# Patient Record
Sex: Female | Born: 1970
Health system: Southern US, Community
[De-identification: ages and names within clinical notes are randomized; demographics above are authoritative.]

## PROBLEM LIST (undated history)

## (undated) DIAGNOSIS — I10 Essential (primary) hypertension: Secondary | ICD-10-CM

## (undated) DIAGNOSIS — T7840XA Allergy, unspecified, initial encounter: Secondary | ICD-10-CM

## (undated) DIAGNOSIS — E049 Nontoxic goiter, unspecified: Secondary | ICD-10-CM

## (undated) DIAGNOSIS — K219 Gastro-esophageal reflux disease without esophagitis: Secondary | ICD-10-CM

## (undated) DIAGNOSIS — E669 Obesity, unspecified: Secondary | ICD-10-CM

## (undated) DIAGNOSIS — F32A Depression, unspecified: Secondary | ICD-10-CM

## (undated) DIAGNOSIS — F419 Anxiety disorder, unspecified: Secondary | ICD-10-CM

## (undated) DIAGNOSIS — M1990 Unspecified osteoarthritis, unspecified site: Secondary | ICD-10-CM

## (undated) DIAGNOSIS — F329 Major depressive disorder, single episode, unspecified: Secondary | ICD-10-CM

## (undated) HISTORY — PX: EYE SURGERY: SHX253

## (undated) HISTORY — PX: CHOLECYSTECTOMY: SHX55

## (undated) HISTORY — DX: Anxiety disorder, unspecified: F41.9

## (undated) HISTORY — DX: Gastro-esophageal reflux disease without esophagitis: K21.9

## (undated) HISTORY — DX: Depression, unspecified: F32.A

## (undated) HISTORY — PX: TUBAL LIGATION: SHX77

## (undated) HISTORY — DX: Allergy, unspecified, initial encounter: T78.40XA

## (undated) HISTORY — DX: Nontoxic goiter, unspecified: E04.9

## (undated) HISTORY — DX: Major depressive disorder, single episode, unspecified: F32.9

## (undated) HISTORY — DX: Obesity, unspecified: E66.9

## (undated) HISTORY — DX: Unspecified osteoarthritis, unspecified site: M19.90

---

## 1991-12-29 HISTORY — PX: BREAST CYST EXCISION: SHX579

## 2013-10-24 ENCOUNTER — Ambulatory Visit: Payer: Self-pay | Admitting: Family Medicine

## 2013-10-26 DIAGNOSIS — E041 Nontoxic single thyroid nodule: Secondary | ICD-10-CM | POA: Insufficient documentation

## 2014-01-02 ENCOUNTER — Ambulatory Visit: Payer: Self-pay | Admitting: Family Medicine

## 2014-01-27 ENCOUNTER — Ambulatory Visit: Payer: Self-pay | Admitting: Ophthalmology

## 2014-02-13 ENCOUNTER — Ambulatory Visit: Payer: Self-pay

## 2014-05-08 DIAGNOSIS — D4989 Neoplasm of unspecified behavior of other specified sites: Secondary | ICD-10-CM | POA: Insufficient documentation

## 2014-07-04 DIAGNOSIS — H04009 Unspecified dacryoadenitis, unspecified lacrimal gland: Secondary | ICD-10-CM | POA: Insufficient documentation

## 2014-07-25 ENCOUNTER — Emergency Department: Payer: Self-pay | Admitting: Emergency Medicine

## 2014-07-25 LAB — CBC
HCT: 41 % (ref 35.0–47.0)
HGB: 13.5 g/dL (ref 12.0–16.0)
MCH: 29.2 pg (ref 26.0–34.0)
MCHC: 33 g/dL (ref 32.0–36.0)
MCV: 88 fL (ref 80–100)
Platelet: 207 x10 3/mm 3 (ref 150–440)
RBC: 4.64 X10 6/mm 3 (ref 3.80–5.20)
RDW: 13.5 % (ref 11.5–14.5)
WBC: 8.1 x10 3/mm 3 (ref 3.6–11.0)

## 2014-07-25 LAB — TROPONIN I: Troponin-I: <0.02 ng/mL

## 2014-07-25 LAB — BASIC METABOLIC PANEL WITH GFR
Anion Gap: 9 (ref 7–16)
BUN: 12 mg/dL (ref 7–18)
Calcium, Total: 8.4 mg/dL — ABNORMAL LOW (ref 8.5–10.1)
Chloride: 106 mmol/L (ref 98–107)
Co2: 25 mmol/L (ref 21–32)
Creatinine: 0.93 mg/dL (ref 0.60–1.30)
EGFR (African American): >60
EGFR (Non-African Amer.): >60
Glucose: 145 mg/dL — ABNORMAL HIGH (ref 65–99)
Osmolality: 282 (ref 275–301)
Potassium: 3.5 mmol/L (ref 3.5–5.1)
Sodium: 140 mmol/L (ref 136–145)

## 2014-08-10 DIAGNOSIS — H05119 Granuloma of unspecified orbit: Secondary | ICD-10-CM | POA: Insufficient documentation

## 2014-11-01 DIAGNOSIS — E559 Vitamin D deficiency, unspecified: Secondary | ICD-10-CM | POA: Insufficient documentation

## 2014-11-01 DIAGNOSIS — E669 Obesity, unspecified: Secondary | ICD-10-CM | POA: Insufficient documentation

## 2014-11-01 DIAGNOSIS — L409 Psoriasis, unspecified: Secondary | ICD-10-CM | POA: Insufficient documentation

## 2014-11-01 DIAGNOSIS — I10 Essential (primary) hypertension: Secondary | ICD-10-CM | POA: Insufficient documentation

## 2014-11-05 DIAGNOSIS — E781 Pure hyperglyceridemia: Secondary | ICD-10-CM | POA: Insufficient documentation

## 2014-11-05 DIAGNOSIS — R7303 Prediabetes: Secondary | ICD-10-CM | POA: Insufficient documentation

## 2015-08-15 ENCOUNTER — Emergency Department
Admission: EM | Admit: 2015-08-15 | Discharge: 2015-08-15 | Disposition: A | Discharge status: Home / Self Care | Payer: 59 | Attending: Student | Admitting: Student

## 2015-08-15 ENCOUNTER — Emergency Department: Payer: 59

## 2015-08-15 DIAGNOSIS — Z87891 Personal history of nicotine dependence: Secondary | ICD-10-CM | POA: Diagnosis not present

## 2015-08-15 DIAGNOSIS — Y998 Other external cause status: Secondary | ICD-10-CM | POA: Insufficient documentation

## 2015-08-15 DIAGNOSIS — Y9389 Activity, other specified: Secondary | ICD-10-CM | POA: Diagnosis not present

## 2015-08-15 DIAGNOSIS — X58XXXA Exposure to other specified factors, initial encounter: Secondary | ICD-10-CM | POA: Diagnosis not present

## 2015-08-15 DIAGNOSIS — I10 Essential (primary) hypertension: Secondary | ICD-10-CM | POA: Insufficient documentation

## 2015-08-15 DIAGNOSIS — S93402A Sprain of unspecified ligament of left ankle, initial encounter: Secondary | ICD-10-CM | POA: Diagnosis not present

## 2015-08-15 DIAGNOSIS — Y9289 Other specified places as the place of occurrence of the external cause: Secondary | ICD-10-CM | POA: Diagnosis not present

## 2015-08-15 DIAGNOSIS — S99912A Unspecified injury of left ankle, initial encounter: Secondary | ICD-10-CM | POA: Diagnosis present

## 2015-08-15 HISTORY — DX: Essential (primary) hypertension: I10

## 2015-08-15 MED ORDER — IBUPROFEN 800 MG PO TABS: 800.0000 mg | ORAL_TABLET | Freq: Three times a day (TID) | ORAL | Status: DC

## 2015-08-15 NOTE — ED Notes (Signed)
Patient to ED with left ankle pain, reports twisting earlier.

## 2015-08-15 NOTE — Discharge Instructions (Signed)
Ankle Sprain An ankle sprain is an injury to the strong, fibrous tissues (ligaments) that hold your ankle bones together.  HOME CARE   Put ice on your ankle for 1-2 days or as told by your doctor.  Put ice in a plastic bag.  Place a towel between your skin and the bag.  Leave the ice on for 15-20 minutes at a time, every 2 hours while you are awake.  Only take medicine as told by your doctor.  Raise (elevate) your injured ankle above the level of your heart as much as possible for 2-3 days.  Use crutches if your doctor tells you to. Slowly put your own weight on the affected ankle. Use the crutches until you can walk without pain.  If you have a plaster splint:  Do not rest it on anything harder than a pillow for 24 hours.  Do not put weight on it.  Do not get it wet.  Take it off to shower or bathe.  If given, use an elastic wrap or support stocking for support. Take the wrap off if your toes lose feeling (numb), tingle, or turn cold or blue.  If you have an air splint:  Add or let out air to make it comfortable.  Take it off at night and to shower and bathe.  Wiggle your toes and move your ankle up and down often while you are wearing it. GET HELP IF:  You have rapidly increasing bruising or puffiness (swelling).  Your toes feel very cold.  You lose feeling in your foot.  Your medicine does not help your pain. GET HELP RIGHT AWAY IF:   Your toes lose feeling (numb) or turn blue.  You have severe pain that is increasing. MAKE SURE YOU:   Understand these instructions.  Will watch your condition.  Will get help right away if you are not doing well or get worse. Document Released: 06/01/2008 Document Revised: 04/30/2014 Document Reviewed: 06/27/2012 [facility] Patient Information 2015 [place], [place]. This information is not intended to replace advice given to you by your health care provider. Make sure you discuss any questions you have with your health care  provider.    ICE AND ELEVATE USE CRUTCHES UNTIL ABLE TO WALK AND BEAR WEIGHT  WITHOUT PAIN IBUPROFEN FOR PAIN AND INFLAMMATION

## 2015-08-15 NOTE — ED Provider Notes (Signed)
[facility] Emergency Department Provider Note  ____________________________________________  Time seen: Approximately 7:54 AM  I have reviewed the triage vital signs and the nursing notes.   HISTORY  Chief Complaint Ankle Pain   HPI Ashley Paul is a 44 y.o. female is here with complaint of left ankle pain. She states she twisted it earlier and now is very painful. She denies any previous problems with her ankle.She states it is very difficult for her to bear weight at this time. Currently she is rates her pain a 10 out of 10.   Past Medical History  Diagnosis Date  . Hypertension     There are no active problems to display for this patient.   Past Surgical History  Procedure Laterality Date  . Cholecystectomy    . Cesarean section      Current Outpatient Rx  Name  Route  Sig  Dispense  Refill  . ibuprofen (ADVIL,MOTRIN) 800 MG tablet   Oral   Take 1 tablet (800 mg total) by mouth 3 (three) times daily.   30 tablet   0     Allergies Review of patient's allergies indicates no known allergies.  No family history on file.  Social History Social History  Substance Use Topics  . Smoking status: Former Research scientist (life sciences)  . Smokeless tobacco: None  . Alcohol Use: None    Review of Systems Constitutional: No fever/chills Cardiovascular: Denies chest pain. Respiratory: Denies shortness of breath. Gastrointestinal: No abdominal pain.  No nausea, no vomiting.   Genitourinary: Negative for dysuria. Musculoskeletal: Negative for back pain. Skin: Negative for rash. Neurological: Negative for headaches, focal weakness or numbness.  10-point ROS otherwise negative.  ____________________________________________   PHYSICAL EXAM:  VITAL SIGNS: ED Triage Vitals  Enc Vitals Group     BP 08/15/15 0750 154/94 mmHg     Pulse Rate 08/15/15 0750 92     Resp 08/15/15 0750 20     Temp 08/15/15 0750 97.7 F (36.5 C)     Temp Source 08/15/15 0750 Oral      SpO2 08/15/15 0750 97 %     Weight 08/15/15 0750 225 lb (102.059 kg)     Height 08/15/15 0750 5\' 8"  (1.727 m)     Head Cir --      Peak Flow --      Pain Score --      Pain Loc --      Pain Edu? --      Excl. in [place]? --     Constitutional: Alert and oriented. Well appearing and in no acute distress. Eyes: Conjunctivae are normal. PERRL. EOMI. Head: Atraumatic. Nose: No congestion/rhinnorhea. Neck: No stridor.   Cardiovascular: Normal rate, regular rhythm. Grossly normal heart sounds.  Good peripheral circulation. Respiratory: Normal respiratory effort.  No retractions. Lungs CTAB. Gastrointestinal: Soft and nontender. No distention. No abdominal bruits. No CVA tenderness. Musculoskeletal: Moderate tenderness to palpation of the left ankle lateral aspect. There is some minimal soft tissue swelling present. Range of motion is restricted secondary to pain. Gait was not tested No lower extremity tenderness nor edema.  No joint effusions. Neurologic:  Normal speech and language. No gross focal neurologic deficits are appreciated.  Skin:  Skin is warm, dry and intact. No rash noted. No ecchymosis or abrasions noted Psychiatric: Mood and affect are normal. Speech and behavior are normal.  ____________________________________________   LABS (all labs ordered are listed, but only abnormal results are displayed)  Labs Reviewed - No data  to display   _RADIOLOGY  Left ankle x-ray per radiologist and reviewed by me was negative for fracture. I, Johnn Hai, personally viewed and evaluated these images (plain radiographs) as part of my medical decision making.  ____________________________________________   PROCEDURES  Procedure(s) performed: None  Critical Care performed: No  ____________________________________________   INITIAL IMPRESSION / ASSESSMENT AND PLAN / ED COURSE  Pertinent labs & imaging results that were available during my care of the patient were reviewed  by me and considered in my medical decision making (see chart for details)Patient is placed in ankle stirrup splint and given crutches. She also take ibuprofen 800 mg as needed for pain.Marland KitchenShe will follow-up with Dr. Mack Guise if any continued problems with her ankle.   ____________________________________________   FINAL CLINICAL IMPRESSION(S) / ED DIAGNOSES  Final diagnoses:  Sprained ankle, left, initial encounter      Johnn Hai, PA-C 08/15/15 [place] Gay[place], MD 08/15/15 [phone]

## 2015-08-27 ENCOUNTER — Other Ambulatory Visit: Payer: Self-pay | Admitting: Family Medicine

## 2015-08-27 DIAGNOSIS — Z1231 Encounter for screening mammogram for malignant neoplasm of breast: Secondary | ICD-10-CM

## 2015-09-03 ENCOUNTER — Ambulatory Visit
Admission: RE | Admit: 2015-09-03 | Discharge: 2015-09-03 | Disposition: A | Discharge status: Home / Self Care | Payer: 59 | Source: Ambulatory Visit | Attending: Family Medicine | Admitting: Family Medicine

## 2015-09-03 DIAGNOSIS — Z1231 Encounter for screening mammogram for malignant neoplasm of breast: Secondary | ICD-10-CM | POA: Insufficient documentation

## 2015-11-08 ENCOUNTER — Other Ambulatory Visit: Payer: Self-pay | Admitting: Family Medicine

## 2015-11-08 DIAGNOSIS — M5412 Radiculopathy, cervical region: Secondary | ICD-10-CM

## 2015-11-28 ENCOUNTER — Ambulatory Visit
Admission: RE | Admit: 2015-11-28 | Discharge: 2015-11-28 | Disposition: A | Discharge status: Home / Self Care | Payer: 59 | Source: Ambulatory Visit | Attending: Family Medicine | Admitting: Family Medicine

## 2015-11-28 DIAGNOSIS — M5090 Cervical disc disorder, unspecified, unspecified cervical region: Secondary | ICD-10-CM | POA: Diagnosis not present

## 2015-11-28 DIAGNOSIS — M5412 Radiculopathy, cervical region: Secondary | ICD-10-CM | POA: Diagnosis not present

## 2015-11-28 DIAGNOSIS — M50321 Other cervical disc degeneration at C4-C5 level: Secondary | ICD-10-CM | POA: Insufficient documentation

## 2015-11-28 DIAGNOSIS — M50322 Other cervical disc degeneration at C5-C6 level: Secondary | ICD-10-CM | POA: Insufficient documentation

## 2015-12-18 ENCOUNTER — Other Ambulatory Visit: Payer: Self-pay | Admitting: Family Medicine

## 2015-12-18 DIAGNOSIS — E041 Nontoxic single thyroid nodule: Secondary | ICD-10-CM

## 2015-12-24 ENCOUNTER — Ambulatory Visit
Admission: RE | Admit: 2015-12-24 | Discharge: 2015-12-24 | Disposition: A | Discharge status: Home / Self Care | Payer: 59 | Source: Ambulatory Visit | Attending: Family Medicine | Admitting: Family Medicine

## 2015-12-24 DIAGNOSIS — E041 Nontoxic single thyroid nodule: Secondary | ICD-10-CM

## 2015-12-24 DIAGNOSIS — E042 Nontoxic multinodular goiter: Secondary | ICD-10-CM | POA: Diagnosis not present

## 2016-03-18 DIAGNOSIS — H5213 Myopia, bilateral: Secondary | ICD-10-CM | POA: Diagnosis not present

## 2016-06-15 DIAGNOSIS — Z6834 Body mass index (BMI) 34.0-34.9, adult: Secondary | ICD-10-CM | POA: Diagnosis not present

## 2016-06-15 DIAGNOSIS — M47892 Other spondylosis, cervical region: Secondary | ICD-10-CM | POA: Diagnosis not present

## 2016-07-13 DIAGNOSIS — M47892 Other spondylosis, cervical region: Secondary | ICD-10-CM | POA: Diagnosis not present

## 2016-07-15 DIAGNOSIS — N939 Abnormal uterine and vaginal bleeding, unspecified: Secondary | ICD-10-CM | POA: Diagnosis not present

## 2016-07-15 DIAGNOSIS — F329 Major depressive disorder, single episode, unspecified: Secondary | ICD-10-CM | POA: Diagnosis not present

## 2016-07-22 ENCOUNTER — Other Ambulatory Visit: Payer: Self-pay | Admitting: Family Medicine

## 2016-07-22 DIAGNOSIS — N939 Abnormal uterine and vaginal bleeding, unspecified: Secondary | ICD-10-CM

## 2016-07-27 ENCOUNTER — Ambulatory Visit: Payer: 59 | Attending: Family Medicine

## 2016-10-09 DIAGNOSIS — H16103 Unspecified superficial keratitis, bilateral: Secondary | ICD-10-CM | POA: Diagnosis not present

## 2016-10-31 DIAGNOSIS — H43811 Vitreous degeneration, right eye: Secondary | ICD-10-CM | POA: Diagnosis not present

## 2016-11-03 DIAGNOSIS — M47812 Spondylosis without myelopathy or radiculopathy, cervical region: Secondary | ICD-10-CM | POA: Diagnosis not present

## 2017-01-27 ENCOUNTER — Encounter: Payer: Self-pay | Admitting: Nurse Practitioner

## 2017-02-02 DIAGNOSIS — Z0000 Encounter for general adult medical examination without abnormal findings: Secondary | ICD-10-CM | POA: Diagnosis not present

## 2017-02-02 DIAGNOSIS — Z1231 Encounter for screening mammogram for malignant neoplasm of breast: Secondary | ICD-10-CM | POA: Diagnosis not present

## 2017-02-02 DIAGNOSIS — I10 Essential (primary) hypertension: Secondary | ICD-10-CM | POA: Diagnosis not present

## 2017-02-02 DIAGNOSIS — R7303 Prediabetes: Secondary | ICD-10-CM | POA: Diagnosis not present

## 2017-02-02 DIAGNOSIS — E559 Vitamin D deficiency, unspecified: Secondary | ICD-10-CM | POA: Diagnosis not present

## 2017-02-08 ENCOUNTER — Encounter: Payer: Self-pay | Admitting: Internal Medicine

## 2017-02-08 ENCOUNTER — Encounter: Payer: Self-pay | Admitting: Nurse Practitioner

## 2017-02-08 ENCOUNTER — Ambulatory Visit (INDEPENDENT_AMBULATORY_CARE_PROVIDER_SITE_OTHER): Payer: 59 | Admitting: Nurse Practitioner

## 2017-02-08 VITALS — BP 108/70 | HR 76 | Ht 68.0 in | Wt 240.6 lb

## 2017-02-08 DIAGNOSIS — K219 Gastro-esophageal reflux disease without esophagitis: Secondary | ICD-10-CM

## 2017-02-08 DIAGNOSIS — R1310 Dysphagia, unspecified: Secondary | ICD-10-CM

## 2017-02-08 MED ORDER — OMEPRAZOLE 20 MG PO CPDR: 20.0000 mg | DELAYED_RELEASE_CAPSULE | Freq: Two times a day (BID) | ORAL | 3 refills | Status: DC

## 2017-02-08 NOTE — Patient Instructions (Signed)
You have been scheduled for an endoscopy. Please follow written instructions given to you at your visit today. If you use inhalers (even only as needed), please bring them with you on the day of your procedure. Your physician has requested that you go to www.startemmi.com and enter the access code given to you at your visit today. This web site gives a general overview about your procedure. However, you should still follow specific instructions given to you by our office regarding your preparation for the procedure.  We have sent the following medications to your pharmacy for you to pick up at your convenience: Omeprazole 20 mg twice daily before meals (in place of zantac)  Discontinue the zantac.  Please purchase the following medications over the counter and take as directed: Gaviscon as needed for breakthrough heartburn  We have given you a handout to look over about GERD.  If you are age 13 or older, your body mass index should be between 23-30. Your Body mass index is 36.58 kg/m. If this is out of the aforementioned range listed, please consider follow up with your Primary Care Provider.  If you are age 26 or younger, your body mass index should be between 19-25. Your Body mass index is 36.58 kg/m. If this is out of the aformentioned range listed, please consider follow up with your Primary Care Provider.

## 2017-02-08 NOTE — Progress Notes (Addendum)
HPI:  Patient is 46 year old female, self-referred, for evaluation of reflux. She actually has several GI issues going on.    Patient has occasional regurgitation but has heartburn almost daily, especially with certain foods. She tried Protonix for about a month but developed shortness of breath and palpitations so she stopped it. No recurrent shortness breath or palpitations off the Protonix. Patient has been taking 2-3 of the Zantac150 mg tablets daily but still has breakthrough heartburn. She complains of solid food dysphagia 2-3 times a week, especially at dinner. She has occasional nausea. Despite GERD, dysphagia and nausea she has maintained her weight.  Patient has had some bowel changes over the last year. Stools are sometimes solid but more often than not they are loose though no more than a couple times a day. She has no blood in her stool. She does have some associated cramps relieved with defecation.   Past Medical History:  Diagnosis Date  . Anxiety   . Arthritis   . Depression   . Goiter   . Hypertension   . Obesity     Past Surgical History:  Procedure Laterality Date  . BREAST CYST EXCISION Right 1993  . CESAREAN SECTION    . CHOLECYSTECTOMY     Family History  Problem Relation Age of Onset  . Heart disease Paternal Grandfather   . Colon cancer Neg Hx   . Esophageal cancer Neg Hx   . Pancreatic cancer Neg Hx   . Stomach cancer Neg Hx   . Liver disease Neg Hx    Social History  Substance Use Topics  . Smoking status: Former Research scientist (life sciences)  . Smokeless tobacco: Never Used  . Alcohol use No   Current Outpatient Prescriptions  Medication Sig Dispense Refill  . losartan-hydrochlorothiazide (HYZAAR) 50-12.5 MG tablet Take 1 tablet by mouth daily.    . sertraline (ZOLOFT) 25 MG tablet Take 25 mg by mouth daily.    . Vitamin D, Ergocalciferol, (DRISDOL) 50000 units CAPS capsule Take 50,000 Units by mouth every 7 (seven) days.     No current facility-administered  medications for this visit.    Allergies  Allergen Reactions  . Azathioprine   . Bupropion   . Lisinopril   . Mycophenolate Mofetil   . Sulfa Antibiotics      Review of Systems: Positive for allergy, sinus trouble, anxiety, arthritis, cough, depression, fatigue, shortness of breath. All other systems reviewed and negative except where noted in HPI.    Physical Exam: BP 108/70   Pulse 76   Ht 5\' 8"  (1.727 m)   Wt 240 lb 9.6 oz (109.1 kg)   BMI 36.58 kg/m  Constitutional:  Obese white femalein no acute distress. Psychiatric: Normal mood and affect. Behavior is normal. HEENT: Normocephalic and atraumatic. Conjunctivae are normal. No scleral icterus. Neck supple.  Cardiovascular: Normal rate, regular rhythm.  Pulmonary/chest: Effort normal and breath sounds normal. No wheezing, rales or rhonchi. Abdominal: Soft, nondistended, nontender. Bowel sounds active throughout. There are no masses palpable. No hepatomegaly. Extremities: no edema Lymphadenopathy: No cervical adenopathy noted. Neurological: Alert and oriented to person place and time. Skin: Skin is warm and dry. No rashes noted.   ASSESSMENT AND PLAN:  1. 46 yo female with intermittent solid food dysphagia / GERD manifested as pyrosis and occasional regurgitation / occasional nausea. Protonix caused palpitations. Symptoms uncontrolled on high dose Zantac. Additionally she has been having solid food dysphagia 2-3 times a week for 6 months.  -stop zantac -trial  of omeprazole 20mg  BID ac -GERD literature given -for evaluation of dysphagia patient will be scheduled for EGD with possible dilation. The risks and benefits of EGD were discussed and the patient agrees to proceed.    2. Goiter, sounds like she is for repeat imaging and possible biopsy soon. Her thyroids studies have been normal.  3. HTN, controlled    Tye Savoy, NP  02/08/2017, 11:25 AM  Agree with Ms. Takai Chiaramonte's assessment and plan. Seems like a GERD  problem but goiters can cause dysphagia also. Gatha Mayer, MD, [facility]

## 2017-02-19 ENCOUNTER — Encounter: Payer: 59 | Admitting: Internal Medicine

## 2017-03-19 ENCOUNTER — Ambulatory Visit (AMBULATORY_SURGERY_CENTER): Payer: 59 | Admitting: Internal Medicine

## 2017-03-19 ENCOUNTER — Encounter: Payer: Self-pay | Admitting: Internal Medicine

## 2017-03-19 VITALS — BP 120/84 | HR 76 | Temp 97.7°F | Resp 18 | Ht 68.0 in | Wt 240.0 lb

## 2017-03-19 DIAGNOSIS — R1310 Dysphagia, unspecified: Secondary | ICD-10-CM | POA: Diagnosis present

## 2017-03-19 DIAGNOSIS — K219 Gastro-esophageal reflux disease without esophagitis: Secondary | ICD-10-CM

## 2017-03-19 DIAGNOSIS — K2100 Gastro-esophageal reflux disease with esophagitis, without bleeding: Secondary | ICD-10-CM

## 2017-03-19 MED ORDER — OMEPRAZOLE 20 MG PO CPDR: 20.0000 mg | DELAYED_RELEASE_CAPSULE | Freq: Every day | ORAL | 3 refills | Status: DC

## 2017-03-19 MED ORDER — SODIUM CHLORIDE 0.9 % IV SOLN: 500.0000 mL | INTRAVENOUS | Status: DC

## 2017-03-19 NOTE — Progress Notes (Signed)
Dental advisory given to patient 

## 2017-03-19 NOTE — Progress Notes (Signed)
A/ox3 pleased with MAC, report to Megan RN 

## 2017-03-19 NOTE — Patient Instructions (Addendum)
   There was slight inflammation in the esophagus - I suggest you take the prilosec every day. I dilated the esophagus.  If you do not get better let me know - but wait 2 months on daily Prilosec 20 mg every AM.  I appreciate the opportunity to care for you. Gatha Mayer, MD, FACG  YOU HAD AN ENDOSCOPIC PROCEDURE TODAY: Refer to the procedure report and other information in the discharge instructions given to you for any specific questions about what was found during the examination. If this information does not answer your questions, please call [place] office at [phone] to clarify.   YOU SHOULD EXPECT: Some feelings of bloating in the abdomen. Passage of more gas than usual. Walking can help get rid of the air that was put into your GI tract during the procedure and reduce the bloating. If you had a lower endoscopy (such as a colonoscopy or flexible sigmoidoscopy) you may notice spotting of blood in your stool or on the toilet paper. Some abdominal soreness may be present for a day or two, also.  DIET: Your first meal following the procedure should be a light meal and then it is ok to progress to your normal diet. A half-sandwich or bowl of soup is an example of a good first meal. Heavy or fried foods are harder to digest and may make you feel nauseous or bloated. Drink plenty of fluids but you should avoid alcoholic beverages for 24 hours. If you had a esophageal dilation, please see attached instructions for diet.    ACTIVITY: Your care partner should take you home directly after the procedure. You should plan to take it easy, moving slowly for the rest of the day. You can resume normal activity the day after the procedure however YOU SHOULD NOT DRIVE, use power tools, machinery or perform tasks that involve climbing or major physical exertion for 24 hours (because of the sedation medicines used during the test).   SYMPTOMS TO REPORT IMMEDIATELY: A gastroenterologist can be reached  at any hour. Please call [phone]  for any of the following symptoms:   Following upper endoscopy (EGD, EUS, ERCP, esophageal dilation) Vomiting of blood or coffee ground material  New, significant abdominal pain  New, significant chest pain or pain under the shoulder blades  Painful or persistently difficult swallowing  New shortness of breath  Black, tarry-looking or red, bloody stools  FOLLOW UP:  If any biopsies were taken you will be contacted by phone or by letter within the next 1-3 weeks. Call [phone]  if you have not heard about the biopsies in 3 weeks.  Please also call with any specific questions about appointments or follow up tests.

## 2017-03-19 NOTE — Op Note (Signed)
[name] Procedure Date: 03/19/2017 10:04 AM MRN: [id number] Endoscopist: Gatha Mayer , MD Age: 46 Referring MD:  Date of Birth: 1971-06-21 Gender: Female Account #: [id number] Procedure:                Upper GI endoscopy Indications:              Dysphagia, Heartburn Medicines:                Propofol per Anesthesia, Monitored Anesthesia Care Procedure:                Pre-Anesthesia Assessment:                           - Prior to the procedure, a History and Physical                            was performed, and patient medications and                            allergies were reviewed. The patient's tolerance of                            previous anesthesia was also reviewed. The risks                            and benefits of the procedure and the sedation                            options and risks were discussed with the patient.                            All questions were answered, and informed consent                            was obtained. Prior Anticoagulants: The patient has                            taken no previous anticoagulant or antiplatelet                            agents. ASA Grade Assessment: II - A patient with                            mild systemic disease. After reviewing the risks                            and benefits, the patient was deemed in                            satisfactory condition to undergo the procedure.                           After obtaining informed consent, the endoscope was  passed under direct vision. Throughout the                            procedure, the patient's blood pressure, pulse, and                            oxygen saturations were monitored continuously. The                            Endoscope was introduced through the mouth, and                            advanced to the second part of duodenum. The upper                            GI  endoscopy was accomplished without difficulty.                            The patient tolerated the procedure well. Scope In: Scope Out: Findings:                 LA Grade A (one or more mucosal breaks less than 5                            mm, not extending between tops of 2 mucosal folds)                            esophagitis with no bleeding was found in the                            distal esophagus.                           The exam was otherwise without abnormality.                           The cardia and gastric fundus were normal on                            retroflexion.                           The scope was withdrawn. Dilation was performed in                            the entire esophagus with a Maloney dilator with                            mild resistance at 54 Fr. Estimated blood loss was                            minimal. Complications:            No immediate complications. Estimated Blood Loss:     Estimated blood loss was minimal. Impression:               -  LA Grade A reflux esophagitis.                           - The examination was otherwise normal.                           - Dilation performed in the entire esophagus.                           - No specimens collected. Recommendation:           - Patient has a contact number available for                            emergencies. The signs and symptoms of potential                            delayed complications were discussed with the                            patient. Return to normal activities tomorrow.                            Written discharge instructions were provided to the                            patient.                           - Clear liquids x 1 hour then soft foods rest of                            day. Start prior diet tomorrow.                           - Continue present medications.                           - Take omeprazole (Prilosec) 20 mg daily every day                             not prn                           If still having swallowing problems in June let Dr.                            Carlean Purl know Gatha Mayer, MD 03/19/2017 10:21:18 AM This report has been signed electronically.

## 2017-03-22 ENCOUNTER — Telehealth: Payer: Self-pay

## 2017-03-22 NOTE — Telephone Encounter (Signed)
  Follow up Call-  Call back number 03/19/2017  Post procedure Call Back phone  # [phone]  Permission to leave phone message Yes  Some recent data might be hidden     Patient questions:  Do you have a fever, pain , or abdominal swelling? No. Pain Score  0 *  Have you tolerated food without any problems? Yes.    Have you been able to return to your normal activities? Yes.    Do you have any questions about your discharge instructions: Diet   No. Medications  No. Follow up visit  No.  Do you have questions or concerns about your Care? No.  Actions: * If pain score is 4 or above: No action needed, pain <4.

## 2017-03-22 NOTE — Telephone Encounter (Signed)
  Follow up Call-  Call back number 03/19/2017  Post procedure Call Back phone  # [phone]  Permission to leave phone message Yes  Some recent data might be hidden     Left Message

## 2017-03-29 ENCOUNTER — Ambulatory Visit: Payer: 59 | Admitting: Dietician

## 2017-04-09 MED FILL — traMADol HCL 50 MG TABS: 50 | 7 days supply | Qty: 60 | Fill #0

## 2017-04-14 ENCOUNTER — Encounter: Payer: Self-pay | Admitting: Dietician

## 2017-04-14 NOTE — Progress Notes (Signed)
Patient has not responded to reschedule appt she missed on 03/29/17. Sent letter to MD.

## 2017-04-19 DIAGNOSIS — M4726 Other spondylosis with radiculopathy, lumbar region: Secondary | ICD-10-CM | POA: Diagnosis not present

## 2017-04-20 ENCOUNTER — Other Ambulatory Visit: Payer: Self-pay | Admitting: Neurosurgery

## 2017-04-20 DIAGNOSIS — M4726 Other spondylosis with radiculopathy, lumbar region: Secondary | ICD-10-CM

## 2017-04-26 DIAGNOSIS — R05 Cough: Secondary | ICD-10-CM | POA: Diagnosis not present

## 2017-04-26 DIAGNOSIS — J9801 Acute bronchospasm: Secondary | ICD-10-CM | POA: Diagnosis not present

## 2017-04-26 MED FILL — traMADol HCL 50 MG TABS: 50 | 7 days supply | Qty: 60 | Fill #1

## 2017-05-03 ENCOUNTER — Ambulatory Visit: Payer: 59

## 2017-05-05 MED FILL — diazePAM 5 MG TABS: 5 | 1 days supply | Qty: 1 | Fill #0

## 2017-05-17 ENCOUNTER — Ambulatory Visit
Admission: RE | Admit: 2017-05-17 | Discharge: 2017-05-17 | Disposition: A | Discharge status: Home / Self Care | Payer: 59 | Source: Ambulatory Visit | Attending: Neurosurgery | Admitting: Neurosurgery

## 2017-05-17 DIAGNOSIS — M5126 Other intervertebral disc displacement, lumbar region: Secondary | ICD-10-CM | POA: Diagnosis not present

## 2017-05-17 DIAGNOSIS — M5116 Intervertebral disc disorders with radiculopathy, lumbar region: Secondary | ICD-10-CM | POA: Insufficient documentation

## 2017-05-17 DIAGNOSIS — M4726 Other spondylosis with radiculopathy, lumbar region: Secondary | ICD-10-CM | POA: Diagnosis present

## 2017-06-11 MED FILL — LOSARTAN-HCTZ 50-12.5 MG TA: 50-12.5 | 90 days supply | Qty: 90 | Fill #0

## 2017-06-22 DIAGNOSIS — I10 Essential (primary) hypertension: Secondary | ICD-10-CM | POA: Diagnosis not present

## 2017-06-22 DIAGNOSIS — Z6836 Body mass index (BMI) 36.0-36.9, adult: Secondary | ICD-10-CM | POA: Diagnosis not present

## 2017-06-22 DIAGNOSIS — M4726 Other spondylosis with radiculopathy, lumbar region: Secondary | ICD-10-CM | POA: Diagnosis not present

## 2017-06-22 DIAGNOSIS — M47812 Spondylosis without myelopathy or radiculopathy, cervical region: Secondary | ICD-10-CM | POA: Diagnosis not present

## 2017-06-22 DIAGNOSIS — M47892 Other spondylosis, cervical region: Secondary | ICD-10-CM | POA: Diagnosis not present

## 2017-06-23 ENCOUNTER — Other Ambulatory Visit: Payer: Self-pay | Admitting: Family Medicine

## 2017-06-23 ENCOUNTER — Other Ambulatory Visit: Payer: Self-pay | Admitting: Neurosurgery

## 2017-06-23 DIAGNOSIS — Z1231 Encounter for screening mammogram for malignant neoplasm of breast: Secondary | ICD-10-CM

## 2017-06-23 DIAGNOSIS — M47892 Other spondylosis, cervical region: Secondary | ICD-10-CM

## 2017-07-02 ENCOUNTER — Other Ambulatory Visit: Payer: Self-pay | Admitting: Neurosurgery

## 2017-07-02 DIAGNOSIS — M47892 Other spondylosis, cervical region: Secondary | ICD-10-CM

## 2017-07-05 ENCOUNTER — Inpatient Hospital Stay: Admission: RE | Admit: 2017-07-05 | Payer: 59 | Source: Ambulatory Visit

## 2017-07-05 DIAGNOSIS — H524 Presbyopia: Secondary | ICD-10-CM | POA: Diagnosis not present

## 2017-07-19 DIAGNOSIS — J4520 Mild intermittent asthma, uncomplicated: Secondary | ICD-10-CM | POA: Diagnosis not present

## 2017-07-22 MED FILL — SERTRALINE HCL 25 MG TABLET: 25 | 30 days supply | Qty: 30 | Fill #0

## 2017-07-22 MED FILL — diazePAM 5 MG TABS: 5 | 1 days supply | Qty: 2 | Fill #0

## 2017-07-30 ENCOUNTER — Ambulatory Visit
Admission: RE | Admit: 2017-07-30 | Discharge: 2017-07-30 | Disposition: A | Discharge status: Home / Self Care | Payer: 59 | Source: Ambulatory Visit | Attending: Neurosurgery | Admitting: Neurosurgery

## 2017-07-30 DIAGNOSIS — M50223 Other cervical disc displacement at C6-C7 level: Secondary | ICD-10-CM | POA: Diagnosis not present

## 2017-07-30 DIAGNOSIS — M47892 Other spondylosis, cervical region: Secondary | ICD-10-CM

## 2017-07-30 DIAGNOSIS — M50222 Other cervical disc displacement at C5-C6 level: Secondary | ICD-10-CM | POA: Diagnosis not present

## 2017-07-30 DIAGNOSIS — M50221 Other cervical disc displacement at C4-C5 level: Secondary | ICD-10-CM | POA: Diagnosis not present

## 2017-07-30 DIAGNOSIS — M5021 Other cervical disc displacement,  high cervical region: Secondary | ICD-10-CM | POA: Diagnosis not present

## 2017-08-25 MED FILL — SERTRALINE HCL 25 MG TABLET: 25 | 30 days supply | Qty: 30 | Fill #0

## 2017-08-26 DIAGNOSIS — M4722 Other spondylosis with radiculopathy, cervical region: Secondary | ICD-10-CM | POA: Diagnosis not present

## 2017-08-27 ENCOUNTER — Other Ambulatory Visit: Payer: Self-pay | Admitting: Neurosurgery

## 2017-09-07 MED FILL — LOSARTAN-HCTZ 50-12.5 MG TA: 50-12.5 | 90 days supply | Qty: 90 | Fill #1

## 2017-09-07 MED FILL — predniSONE 20 MG TABS: 20 | 7 days supply | Qty: 14 | Fill #0

## 2017-10-08 NOTE — Progress Notes (Addendum)
XYB:FXOVAN, Ashley Battiest, MD  Cardiologist: pt cenies  EKG: pt denies past year  Stress test: pt denies ever  ECHO: pt denies ever  Cardiac Cath: pt denies ever  Chest x-ray: pt denies past year, no recent respiratory infection/complications

## 2017-10-08 NOTE — Pre-Procedure Instructions (Signed)
Ashley Paul  10/08/2017      [place], [address] [place] [address] Phone: [phone] Fax: [place], [place] - [address] [place] [address] [place] [address] Phone: [phone] Fax: [phone]    Your procedure is scheduled on October 20, 2017.  Report to [facility] Admitting at 715 AM.  Call this number if you have problems the morning of surgery:  [phone]   Remember:  Do not eat food or drink liquids after midnight.  Take these medicines the morning of surgery with A SIP OF WATER albuterol inhaler-if needed (bring inhaler with you), omeprazole (prilosec), sertraline (zoloft).   7 days prior to surgery STOP taking any Aspirin (unless otherwise instructed by your surgeon), Aleve, Naproxen, Ibuprofen, Motrin, Advil, Goody's, BC's, all herbal medications, fish oil, and all vitamins  Continue all other medications as instructed by your physician except follow the above medication instructions before surgery   Do not wear jewelry, make-up or nail polish.  Do not wear lotions, powders, or perfumes, or deoderant.  Do not shave 48 hours prior to surgery.    Do not bring valuables to the hospital.  [facility] is not responsible for any belongings or valuables.  Contacts, dentures or bridgework may not be worn into surgery.  Leave your suitcase in the car.  After surgery it may be brought to your room.  For patients admitted to the hospital, discharge time will be determined by your treatment team.  Patients discharged the day of surgery will not be allowed to drive home.   Special instructions:   [facility]- Preparing For Surgery  Before surgery, you can play an important role. Because skin is not sterile, your skin needs to be as free of germs as possible. You can reduce the number of germs on your skin by  washing with CHG (chlorahexidine gluconate) Soap before surgery.  CHG is an antiseptic cleaner which kills germs and bonds with the skin to continue killing germs even after washing.  Please do not use if you have an allergy to CHG or antibacterial soaps. If your skin becomes reddened/irritated stop using the CHG.  Do not shave (including legs and underarms) for at least 48 hours prior to first CHG shower. It is OK to shave your face.  Please follow these instructions carefully.   1. Shower the NIGHT BEFORE SURGERY and the MORNING OF SURGERY with CHG.   2. If you chose to wash your hair, wash your hair first as usual with your normal shampoo.  3. After you shampoo, rinse your hair and body thoroughly to remove the shampoo.  4. Use CHG as you would any other liquid soap. You can apply CHG directly to the skin and wash gently with a scrungie or a clean washcloth.   5. Apply the CHG Soap to your body ONLY FROM THE NECK DOWN.  Do not use on open wounds or open sores. Avoid contact with your eyes, ears, mouth and genitals (private parts). Wash Face and genitals (private parts)  with your normal soap.  6. Wash thoroughly, paying special attention to the area where your surgery will be performed.  7. Thoroughly rinse your body with warm water from the neck down.  8. DO NOT shower/wash with your normal soap after using and rinsing off the CHG Soap.  9. Pat yourself dry  with a CLEAN TOWEL.  10. Wear CLEAN PAJAMAS to bed the night before surgery, wear comfortable clothes the morning of surgery  11. Place CLEAN SHEETS on your bed the night of your first shower and DO NOT SLEEP WITH PETS.    Day of Surgery: Do not apply any deodorants/lotions. Please wear clean clothes to the hospital/surgery center.    Please read over the following fact sheets that you were given. Pain Booklet, Coughing and Deep Breathing, MRSA Information and Surgical Site Infection Prevention

## 2017-10-11 ENCOUNTER — Encounter (HOSPITAL_COMMUNITY)
Admission: RE | Admit: 2017-10-11 | Discharge: 2017-10-11 | Disposition: A | Discharge status: Home / Self Care | Payer: 59 | Source: Ambulatory Visit | Attending: Neurosurgery | Admitting: Neurosurgery

## 2017-10-11 ENCOUNTER — Encounter (HOSPITAL_COMMUNITY): Payer: Self-pay

## 2017-10-11 DIAGNOSIS — M4722 Other spondylosis with radiculopathy, cervical region: Secondary | ICD-10-CM | POA: Insufficient documentation

## 2017-10-11 DIAGNOSIS — Z01810 Encounter for preprocedural cardiovascular examination: Secondary | ICD-10-CM | POA: Diagnosis not present

## 2017-10-11 DIAGNOSIS — Z01812 Encounter for preprocedural laboratory examination: Secondary | ICD-10-CM | POA: Insufficient documentation

## 2017-10-11 LAB — BASIC METABOLIC PANEL
ANION GAP: 10 (ref 5–15)
BUN: 15 mg/dL (ref 6–20)
CHLORIDE: 107 mmol/L (ref 101–111)
CO2: 20 mmol/L — ABNORMAL LOW (ref 22–32)
Calcium: 9.1 mg/dL (ref 8.9–10.3)
Creatinine, Ser: 0.72 mg/dL (ref 0.44–1.00)
GFR calc Af Amer: >60 mL/min (ref >60)
GFR calc non Af Amer: >60 mL/min (ref >60)
GLUCOSE: 153 mg/dL — AB (ref 65–99)
POTASSIUM: 3.7 mmol/L (ref 3.5–5.1)
Sodium: 137 mmol/L (ref 135–145)

## 2017-10-11 LAB — SURGICAL PCR SCREEN
MRSA, PCR: NEGATIVE
STAPHYLOCOCCUS AUREUS: POSITIVE — AB

## 2017-10-11 LAB — CBC
HEMATOCRIT: 41 % (ref 36.0–46.0)
HEMOGLOBIN: 13.4 g/dL (ref 12.0–15.0)
MCH: 27.9 pg (ref 26.0–34.0)
MCHC: 32.7 g/dL (ref 30.0–36.0)
MCV: 85.2 fL (ref 78.0–100.0)
Platelets: 274 10*3/uL (ref 150–400)
RBC: 4.81 MIL/uL (ref 3.87–5.11)
RDW: 13.7 % (ref 11.5–15.5)
WBC: 6 10*3/uL (ref 4.0–10.5)

## 2017-10-11 LAB — HCG, SERUM, QUALITATIVE: Preg, Serum: NEGATIVE

## 2017-10-12 ENCOUNTER — Other Ambulatory Visit (HOSPITAL_COMMUNITY): Payer: 59

## 2017-10-20 ENCOUNTER — Ambulatory Visit (HOSPITAL_COMMUNITY)
Admission: RE | Admit: 2017-10-20 | Discharge: 2017-10-20 | Disposition: A | Discharge status: Home / Self Care | Payer: 59 | Source: Ambulatory Visit | Attending: Neurosurgery | Admitting: Neurosurgery

## 2017-10-20 ENCOUNTER — Encounter (HOSPITAL_COMMUNITY)
Admission: RE | Disposition: A | Discharge status: Home / Self Care | Payer: Self-pay | Source: Ambulatory Visit | Attending: Neurosurgery

## 2017-10-20 ENCOUNTER — Encounter (HOSPITAL_COMMUNITY): Payer: Self-pay | Admitting: *Deleted

## 2017-10-20 ENCOUNTER — Ambulatory Visit (HOSPITAL_COMMUNITY): Payer: 59 | Admitting: Anesthesiology

## 2017-10-20 ENCOUNTER — Ambulatory Visit (HOSPITAL_COMMUNITY): Payer: 59

## 2017-10-20 DIAGNOSIS — M47812 Spondylosis without myelopathy or radiculopathy, cervical region: Secondary | ICD-10-CM | POA: Diagnosis not present

## 2017-10-20 DIAGNOSIS — K219 Gastro-esophageal reflux disease without esophagitis: Secondary | ICD-10-CM | POA: Diagnosis not present

## 2017-10-20 DIAGNOSIS — M4802 Spinal stenosis, cervical region: Secondary | ICD-10-CM | POA: Diagnosis not present

## 2017-10-20 DIAGNOSIS — M47892 Other spondylosis, cervical region: Secondary | ICD-10-CM | POA: Diagnosis not present

## 2017-10-20 DIAGNOSIS — I10 Essential (primary) hypertension: Secondary | ICD-10-CM | POA: Diagnosis not present

## 2017-10-20 DIAGNOSIS — Z981 Arthrodesis status: Secondary | ICD-10-CM | POA: Diagnosis not present

## 2017-10-20 DIAGNOSIS — Z791 Long term (current) use of non-steroidal anti-inflammatories (NSAID): Secondary | ICD-10-CM | POA: Diagnosis not present

## 2017-10-20 DIAGNOSIS — Z9049 Acquired absence of other specified parts of digestive tract: Secondary | ICD-10-CM | POA: Diagnosis not present

## 2017-10-20 DIAGNOSIS — F418 Other specified anxiety disorders: Secondary | ICD-10-CM | POA: Diagnosis not present

## 2017-10-20 DIAGNOSIS — M4722 Other spondylosis with radiculopathy, cervical region: Secondary | ICD-10-CM | POA: Diagnosis not present

## 2017-10-20 DIAGNOSIS — M5021 Other cervical disc displacement,  high cervical region: Secondary | ICD-10-CM | POA: Diagnosis not present

## 2017-10-20 DIAGNOSIS — Z87891 Personal history of nicotine dependence: Secondary | ICD-10-CM | POA: Diagnosis not present

## 2017-10-20 DIAGNOSIS — Z419 Encounter for procedure for purposes other than remedying health state, unspecified: Secondary | ICD-10-CM

## 2017-10-20 DIAGNOSIS — Z79899 Other long term (current) drug therapy: Secondary | ICD-10-CM | POA: Diagnosis not present

## 2017-10-20 HISTORY — PX: CERVICAL DISC ARTHROPLASTY: SHX587

## 2017-10-20 SURGERY — CERVICAL ANTERIOR DISC ARTHROPLASTY
Anesthesia: General | Site: Neck

## 2017-10-20 MED ORDER — TIZANIDINE HCL 4 MG PO TABS: 4.0000 mg | ORAL_TABLET | Freq: Four times a day (QID) | ORAL | 0 refills | Status: DC | PRN

## 2017-10-20 MED ORDER — ONDANSETRON HCL 4 MG/2ML IJ SOLN: 4.0000 mg | Freq: Four times a day (QID) | INTRAMUSCULAR | Status: DC | PRN

## 2017-10-20 MED ORDER — POTASSIUM CHLORIDE IN NACL 20-0.9 MEQ/L-% IV SOLN: INTRAVENOUS | Status: DC

## 2017-10-20 MED ORDER — HEMOSTATIC AGENTS (NO CHARGE) OPTIME
TOPICAL | Status: DC | PRN
  Administered 2017-10-20: 1 via TOPICAL

## 2017-10-20 MED ORDER — ROCURONIUM BROMIDE 100 MG/10ML IV SOLN
INTRAVENOUS | Status: DC | PRN
  Administered 2017-10-20: 50 mg via INTRAVENOUS
  Administered 2017-10-20: 20 mg via INTRAVENOUS
  Administered 2017-10-20: 10 mg via INTRAVENOUS

## 2017-10-20 MED ORDER — ACETAMINOPHEN 325 MG PO TABS: 650.0000 mg | ORAL_TABLET | ORAL | Status: DC | PRN

## 2017-10-20 MED ORDER — LACTATED RINGERS IV SOLN
INTRAVENOUS | Status: DC
  Administered 2017-10-20 (×2): via INTRAVENOUS

## 2017-10-20 MED ORDER — LOSARTAN POTASSIUM 50 MG PO TABS
50.0000 mg | ORAL_TABLET | Freq: Every day | ORAL | Status: DC
  Administered 2017-10-20: 50 mg via ORAL
  Filled 2017-10-20: qty 1

## 2017-10-20 MED ORDER — OXYCODONE HCL 5 MG PO TABS
ORAL_TABLET | ORAL | Status: AC
  Filled 2017-10-20: qty 1

## 2017-10-20 MED ORDER — SERTRALINE HCL 50 MG PO TABS: 25.0000 mg | ORAL_TABLET | Freq: Every day | ORAL | Status: DC

## 2017-10-20 MED ORDER — ACETAMINOPHEN 650 MG RE SUPP: 650.0000 mg | RECTAL | Status: DC | PRN

## 2017-10-20 MED ORDER — CEFAZOLIN SODIUM-DEXTROSE 2-4 GM/100ML-% IV SOLN
INTRAVENOUS | Status: AC
  Filled 2017-10-20: qty 100

## 2017-10-20 MED ORDER — SENNOSIDES-DOCUSATE SODIUM 8.6-50 MG PO TABS: 1.0000 | ORAL_TABLET | Freq: Every evening | ORAL | Status: DC | PRN

## 2017-10-20 MED ORDER — HYDROCODONE-ACETAMINOPHEN 5-325 MG PO TABS: 1.0000 | ORAL_TABLET | Freq: Four times a day (QID) | ORAL | 0 refills | Status: DC | PRN

## 2017-10-20 MED ORDER — KETOROLAC TROMETHAMINE 15 MG/ML IJ SOLN
15.0000 mg | Freq: Four times a day (QID) | INTRAMUSCULAR | Status: DC
  Administered 2017-10-20: 15 mg via INTRAVENOUS
  Filled 2017-10-20: qty 1

## 2017-10-20 MED ORDER — MORPHINE SULFATE (PF) 4 MG/ML IV SOLN: 2.0000 mg | INTRAVENOUS | Status: DC | PRN

## 2017-10-20 MED ORDER — LIDOCAINE-EPINEPHRINE 0.5 %-1:200000 IJ SOLN
INTRAMUSCULAR | Status: AC
  Filled 2017-10-20: qty 1

## 2017-10-20 MED ORDER — MIDAZOLAM HCL 5 MG/5ML IJ SOLN
INTRAMUSCULAR | Status: DC | PRN
  Administered 2017-10-20: 2 mg via INTRAVENOUS

## 2017-10-20 MED ORDER — OXYCODONE HCL ER 10 MG PO T12A
10.0000 mg | EXTENDED_RELEASE_TABLET | Freq: Two times a day (BID) | ORAL | Status: DC
  Administered 2017-10-20: 10 mg via ORAL
  Filled 2017-10-20: qty 1

## 2017-10-20 MED ORDER — CHLORHEXIDINE GLUCONATE CLOTH 2 % EX PADS: 6.0000 | MEDICATED_PAD | Freq: Once | CUTANEOUS | Status: DC

## 2017-10-20 MED ORDER — ONDANSETRON HCL 4 MG PO TABS: 4.0000 mg | ORAL_TABLET | Freq: Four times a day (QID) | ORAL | Status: DC | PRN

## 2017-10-20 MED ORDER — DOCUSATE SODIUM 100 MG PO CAPS: 100.0000 mg | ORAL_CAPSULE | Freq: Two times a day (BID) | ORAL | Status: DC

## 2017-10-20 MED ORDER — LIDOCAINE HCL (CARDIAC) 20 MG/ML IV SOLN
INTRAVENOUS | Status: DC | PRN
  Administered 2017-10-20: 100 mg via INTRAVENOUS

## 2017-10-20 MED ORDER — OXYCODONE HCL 5 MG PO TABS
5.0000 mg | ORAL_TABLET | Freq: Once | ORAL | Status: AC | PRN
  Administered 2017-10-20: 5 mg via ORAL

## 2017-10-20 MED ORDER — GLYCOPYRROLATE 0.2 MG/ML IJ SOLN
INTRAMUSCULAR | Status: DC | PRN
  Administered 2017-10-20: 0.6 mg via INTRAVENOUS

## 2017-10-20 MED ORDER — PROPOFOL 10 MG/ML IV BOLUS
INTRAVENOUS | Status: AC
  Filled 2017-10-20: qty 20

## 2017-10-20 MED ORDER — NEOSTIGMINE METHYLSULFATE 10 MG/10ML IV SOLN
INTRAVENOUS | Status: DC | PRN
  Administered 2017-10-20: 4 mg via INTRAVENOUS

## 2017-10-20 MED ORDER — ONDANSETRON HCL 4 MG/2ML IJ SOLN
INTRAMUSCULAR | Status: AC
  Filled 2017-10-20: qty 2

## 2017-10-20 MED ORDER — PROMETHAZINE HCL 25 MG/ML IJ SOLN
6.2500 mg | INTRAMUSCULAR | Status: DC | PRN
Start: 2017-10-20 — End: 2017-10-20

## 2017-10-20 MED ORDER — BISACODYL 5 MG PO TBEC: 5.0000 mg | DELAYED_RELEASE_TABLET | Freq: Every day | ORAL | Status: DC | PRN

## 2017-10-20 MED ORDER — DEXAMETHASONE SODIUM PHOSPHATE 10 MG/ML IJ SOLN
INTRAMUSCULAR | Status: AC
  Filled 2017-10-20: qty 1

## 2017-10-20 MED ORDER — GABAPENTIN 300 MG PO CAPS
300.0000 mg | ORAL_CAPSULE | Freq: Three times a day (TID) | ORAL | Status: DC
  Administered 2017-10-20: 300 mg via ORAL
  Filled 2017-10-20: qty 1

## 2017-10-20 MED ORDER — SODIUM CHLORIDE 0.9% FLUSH: 3.0000 mL | INTRAVENOUS | Status: DC | PRN

## 2017-10-20 MED ORDER — MENTHOL 3 MG MT LOZG: 1.0000 | LOZENGE | OROMUCOSAL | Status: DC | PRN

## 2017-10-20 MED ORDER — MEPERIDINE HCL 25 MG/ML IJ SOLN: 6.2500 mg | INTRAMUSCULAR | Status: DC | PRN

## 2017-10-20 MED ORDER — 0.9 % SODIUM CHLORIDE (POUR BTL) OPTIME
TOPICAL | Status: DC | PRN
  Administered 2017-10-20: 1000 mL

## 2017-10-20 MED ORDER — HYDROCHLOROTHIAZIDE 12.5 MG PO CAPS
12.5000 mg | ORAL_CAPSULE | Freq: Every day | ORAL | Status: DC
  Administered 2017-10-20: 12.5 mg via ORAL
  Filled 2017-10-20: qty 1

## 2017-10-20 MED ORDER — HYDROMORPHONE HCL 1 MG/ML IJ SOLN
0.2500 mg | INTRAMUSCULAR | Status: DC | PRN
  Administered 2017-10-20 (×4): 0.5 mg via INTRAVENOUS

## 2017-10-20 MED ORDER — OXYCODONE HCL 5 MG/5ML PO SOLN: 5.0000 mg | Freq: Once | ORAL | Status: AC | PRN

## 2017-10-20 MED ORDER — PHENOL 1.4 % MT LIQD: 1.0000 | OROMUCOSAL | Status: DC | PRN

## 2017-10-20 MED ORDER — SODIUM CHLORIDE 0.9% FLUSH
3.0000 mL | Freq: Two times a day (BID) | INTRAVENOUS | Status: DC
  Administered 2017-10-20: 3 mL via INTRAVENOUS

## 2017-10-20 MED ORDER — FENTANYL CITRATE (PF) 250 MCG/5ML IJ SOLN
INTRAMUSCULAR | Status: AC
  Filled 2017-10-20: qty 5

## 2017-10-20 MED ORDER — HYDROMORPHONE HCL 1 MG/ML IJ SOLN
INTRAMUSCULAR | Status: AC
  Administered 2017-10-20: 0.5 mg via INTRAVENOUS
  Filled 2017-10-20: qty 1

## 2017-10-20 MED ORDER — OXYCODONE HCL 5 MG PO TABS: 5.0000 mg | ORAL_TABLET | ORAL | Status: DC | PRN

## 2017-10-20 MED ORDER — CEFAZOLIN SODIUM-DEXTROSE 2-4 GM/100ML-% IV SOLN
2.0000 g | INTRAVENOUS | Status: AC
  Administered 2017-10-20: 2 g via INTRAVENOUS

## 2017-10-20 MED ORDER — DIAZEPAM 5 MG PO TABS
5.0000 mg | ORAL_TABLET | Freq: Four times a day (QID) | ORAL | Status: DC | PRN
  Administered 2017-10-20: 5 mg via ORAL
  Filled 2017-10-20: qty 1

## 2017-10-20 MED ORDER — ONDANSETRON HCL 4 MG/2ML IJ SOLN
INTRAMUSCULAR | Status: DC | PRN
  Administered 2017-10-20: 4 mg via INTRAVENOUS

## 2017-10-20 MED ORDER — LIDOCAINE-EPINEPHRINE 0.5 %-1:200000 IJ SOLN
INTRAMUSCULAR | Status: DC | PRN
  Administered 2017-10-20: 6 mL

## 2017-10-20 MED ORDER — THROMBIN (RECOMBINANT) 20000 UNITS EX SOLR
CUTANEOUS | Status: AC
  Filled 2017-10-20: qty 20000

## 2017-10-20 MED ORDER — NEOSTIGMINE METHYLSULFATE 5 MG/5ML IV SOSY
PREFILLED_SYRINGE | INTRAVENOUS | Status: AC
  Filled 2017-10-20: qty 5

## 2017-10-20 MED ORDER — ALBUTEROL SULFATE (2.5 MG/3ML) 0.083% IN NEBU: 2.5000 mg | INHALATION_SOLUTION | Freq: Four times a day (QID) | RESPIRATORY_TRACT | Status: DC | PRN

## 2017-10-20 MED ORDER — FENTANYL CITRATE (PF) 100 MCG/2ML IJ SOLN
INTRAMUSCULAR | Status: DC | PRN
  Administered 2017-10-20: 50 ug via INTRAVENOUS
  Administered 2017-10-20 (×2): 100 ug via INTRAVENOUS

## 2017-10-20 MED ORDER — MAGNESIUM CITRATE PO SOLN: 1.0000 | Freq: Once | ORAL | Status: DC | PRN

## 2017-10-20 MED ORDER — THROMBIN (RECOMBINANT) 5000 UNITS EX SOLR
CUTANEOUS | Status: DC | PRN
  Administered 2017-10-20: 5000 [IU] via TOPICAL

## 2017-10-20 MED ORDER — MIDAZOLAM HCL 2 MG/2ML IJ SOLN
INTRAMUSCULAR | Status: AC
  Filled 2017-10-20: qty 2

## 2017-10-20 MED ORDER — LIDOCAINE 2% (20 MG/ML) 5 ML SYRINGE
INTRAMUSCULAR | Status: AC
  Filled 2017-10-20: qty 5

## 2017-10-20 MED ORDER — THROMBIN (RECOMBINANT) 5000 UNITS EX SOLR
CUTANEOUS | Status: AC
  Filled 2017-10-20: qty 10000

## 2017-10-20 MED ORDER — OMEPRAZOLE 20 MG PO CPDR
20.0000 mg | DELAYED_RELEASE_CAPSULE | Freq: Every day | ORAL | Status: DC
  Filled 2017-10-20: qty 1

## 2017-10-20 MED ORDER — ROCURONIUM BROMIDE 10 MG/ML (PF) SYRINGE
PREFILLED_SYRINGE | INTRAVENOUS | Status: AC
  Filled 2017-10-20: qty 5

## 2017-10-20 MED ORDER — ZOLPIDEM TARTRATE 5 MG PO TABS: 5.0000 mg | ORAL_TABLET | Freq: Every evening | ORAL | Status: DC | PRN

## 2017-10-20 MED ORDER — LOSARTAN POTASSIUM-HCTZ 50-12.5 MG PO TABS: 1.0000 | ORAL_TABLET | Freq: Every day | ORAL | Status: DC

## 2017-10-20 MED ORDER — PROPOFOL 10 MG/ML IV BOLUS
INTRAVENOUS | Status: DC | PRN
  Administered 2017-10-20: 150 mg via INTRAVENOUS

## 2017-10-20 MED ORDER — DEXAMETHASONE SODIUM PHOSPHATE 10 MG/ML IJ SOLN
INTRAMUSCULAR | Status: DC | PRN
  Administered 2017-10-20: 10 mg via INTRAVENOUS

## 2017-10-20 MED ORDER — OXYCODONE HCL 5 MG PO TABS
10.0000 mg | ORAL_TABLET | ORAL | Status: DC | PRN
  Administered 2017-10-20: 10 mg via ORAL
  Filled 2017-10-20: qty 2

## 2017-10-20 SURGICAL SUPPLY — 72 items
BIT CUTTER RAIL 1.5 (BIT) ×2 IMPLANT
BIT CUTTER RAIL 1.5MM (BIT) ×1
BLADE CLIPPER SURG (BLADE) IMPLANT
BNDG GAUZE ELAST 4 BULKY (GAUZE/BANDAGES/DRESSINGS) ×6 IMPLANT
BUR DRUM 4.0 (BURR) ×2 IMPLANT
BUR DRUM 4.0MM (BURR) ×1
BUR MATCHSTICK NEURO 3.0 LAGG (BURR) ×3 IMPLANT
CAGE PRESTIGE LP 5X14 (Cage) ×2 IMPLANT
CAGE PRESTIGE LP 5X14MM (Cage) ×1 IMPLANT
CANISTER SUCT 3000ML PPV (MISCELLANEOUS) ×3 IMPLANT
CARTRIDGE OIL MAESTRO DRILL (MISCELLANEOUS) ×1 IMPLANT
DECANTER SPIKE VIAL GLASS SM (MISCELLANEOUS) ×3 IMPLANT
DERMABOND ADVANCED (GAUZE/BANDAGES/DRESSINGS) ×2
DERMABOND ADVANCED .7 DNX12 (GAUZE/BANDAGES/DRESSINGS) ×1 IMPLANT
DIFFUSER DRILL AIR PNEUMATIC (MISCELLANEOUS) ×3 IMPLANT
DRAPE C-ARM 42X72 X-RAY (DRAPES) ×3 IMPLANT
DRAPE C-ARMOR (DRAPES) ×3 IMPLANT
DRAPE HALF SHEET 40X57 (DRAPES) IMPLANT
DRAPE LAPAROTOMY 100X72 PEDS (DRAPES) ×3 IMPLANT
DRAPE MICROSCOPE LEICA (MISCELLANEOUS) ×3 IMPLANT
DRAPE POUCH INSTRU U-SHP 10X18 (DRAPES) ×3 IMPLANT
DURAPREP 6ML APPLICATOR 50/CS (WOUND CARE) ×3 IMPLANT
ELECT COATED BLADE 2.86 ST (ELECTRODE) ×3 IMPLANT
ELECT REM PT RETURN 9FT ADLT (ELECTROSURGICAL) ×3
ELECTRODE REM PT RTRN 9FT ADLT (ELECTROSURGICAL) ×1 IMPLANT
GAUZE SPONGE 4X4 16PLY XRAY LF (GAUZE/BANDAGES/DRESSINGS) IMPLANT
GLOVE BIO SURGEON STRL SZ 6.5 (GLOVE) IMPLANT
GLOVE BIO SURGEON STRL SZ7 (GLOVE) IMPLANT
GLOVE BIO SURGEON STRL SZ7.5 (GLOVE) IMPLANT
GLOVE BIO SURGEON STRL SZ8 (GLOVE) IMPLANT
GLOVE BIO SURGEON STRL SZ8.5 (GLOVE) IMPLANT
GLOVE BIO SURGEONS STRL SZ 6.5 (GLOVE)
GLOVE BIOGEL M 8.0 STRL (GLOVE) IMPLANT
GLOVE ECLIPSE 6.5 STRL STRAW (GLOVE) ×3 IMPLANT
GLOVE ECLIPSE 7.0 STRL STRAW (GLOVE) IMPLANT
GLOVE ECLIPSE 7.5 STRL STRAW (GLOVE) ×15 IMPLANT
GLOVE ECLIPSE 8.0 STRL XLNG CF (GLOVE) IMPLANT
GLOVE ECLIPSE 8.5 STRL (GLOVE) IMPLANT
GLOVE EXAM NITRILE LRG STRL (GLOVE) IMPLANT
GLOVE EXAM NITRILE XL STR (GLOVE) IMPLANT
GLOVE EXAM NITRILE XS STR PU (GLOVE) IMPLANT
GLOVE INDICATOR 6.5 STRL GRN (GLOVE) ×6 IMPLANT
GLOVE INDICATOR 7.0 STRL GRN (GLOVE) IMPLANT
GLOVE INDICATOR 7.5 STRL GRN (GLOVE) IMPLANT
GLOVE INDICATOR 8.0 STRL GRN (GLOVE) ×9 IMPLANT
GLOVE INDICATOR 8.5 STRL (GLOVE) IMPLANT
GLOVE OPTIFIT SS 8.0 STRL (GLOVE) IMPLANT
GLOVE SURG SS PI 6.0 STRL IVOR (GLOVE) ×6 IMPLANT
GLOVE SURG SS PI 6.5 STRL IVOR (GLOVE) IMPLANT
GOWN STRL REUS W/ TWL LRG LVL3 (GOWN DISPOSABLE) ×2 IMPLANT
GOWN STRL REUS W/ TWL XL LVL3 (GOWN DISPOSABLE) ×1 IMPLANT
GOWN STRL REUS W/TWL 2XL LVL3 (GOWN DISPOSABLE) ×3 IMPLANT
GOWN STRL REUS W/TWL LRG LVL3 (GOWN DISPOSABLE) ×4
GOWN STRL REUS W/TWL XL LVL3 (GOWN DISPOSABLE) ×2
KIT BASIN OR (CUSTOM PROCEDURE TRAY) ×3 IMPLANT
KIT ROOM TURNOVER OR (KITS) ×3 IMPLANT
NEEDLE HYPO 25X1 1.5 SAFETY (NEEDLE) ×3 IMPLANT
NEEDLE SPNL 22GX3.5 QUINCKE BK (NEEDLE) ×9 IMPLANT
NS IRRIG 1000ML POUR BTL (IV SOLUTION) ×3 IMPLANT
OIL CARTRIDGE MAESTRO DRILL (MISCELLANEOUS) ×3
PACK LAMINECTOMY NEURO (CUSTOM PROCEDURE TRAY) ×3 IMPLANT
PAD ARMBOARD 7.5X6 YLW CONV (MISCELLANEOUS) ×9 IMPLANT
RUBBERBAND STERILE (MISCELLANEOUS) ×6 IMPLANT
SPONGE INTESTINAL PEANUT (DISPOSABLE) ×3 IMPLANT
SPONGE SURGIFOAM ABS GEL SZ50 (HEMOSTASIS) ×3 IMPLANT
SUT PROLENE 6 0 BV (SUTURE) ×3 IMPLANT
SUT VIC AB 0 CT1 27 (SUTURE) ×2
SUT VIC AB 0 CT1 27XBRD ANTBC (SUTURE) ×1 IMPLANT
SUT VIC AB 3-0 SH 8-18 (SUTURE) ×6 IMPLANT
TOWEL GREEN STERILE (TOWEL DISPOSABLE) ×3 IMPLANT
TOWEL GREEN STERILE FF (TOWEL DISPOSABLE) ×3 IMPLANT
WATER STERILE IRR 1000ML POUR (IV SOLUTION) ×3 IMPLANT

## 2017-10-20 NOTE — Transfer of Care (Signed)
Immediate Anesthesia Transfer of Care Note  Patient: Ashley Paul  Procedure(s) Performed: ARTIFICIAL DISC REPLACEMENT CERVICAL FIVE- CERVICAL SIX (N/A Neck)  Patient Location: PACU  Anesthesia Type:General  Level of Consciousness: awake, alert , oriented and patient cooperative  Airway & Oxygen Therapy: Patient Spontanous Breathing and Patient connected to nasal cannula oxygen  Post-op Assessment: Report given to RN and Post -op Vital signs reviewed and stable  Post vital signs: Reviewed and stable  Last Vitals:  Vitals:   10/20/17 0816 10/20/17 1236  BP: (!) 153/88 (!) 141/85  Pulse: 89 (!) 109  Resp: 18 14  Temp: 36.4 C 36.6 C  SpO2: 99% 94%    Last Pain:  Vitals:   10/20/17 1236  TempSrc:   PainSc: 0-No pain      Patients Stated Pain Goal: 4 (16/10/96 0454)  Complications: No apparent anesthesia complications

## 2017-10-20 NOTE — Op Note (Signed)
10/20/2017  12:34 PM  PATIENT:  Ashley Paul  46 y.o. 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 She was positioned supine with her head in slight extension on bed. The neck was prepped and draped in a sterile manner. I infiltrated 6 cc's 1/2%lidocaine/1:200,000 strength epinephrine into the planned incision starting from the midline to the medial border of the left sternocleidomastoid muscle. I opened the incision with a 10 blade and dissected sharply through soft tissue to the platysma. I dissected in the plane superior to the platysma both rostrally and caudally. I then opened the platysma in a horizontal fashion with Metzenbaum scissors, and dissected in the inferior plane rostrally and caudally. With both blunt and sharp technique I created an avascular corridor to the cervical spine. I placed a spinal needle(s) in the disc space at 5/6 . I then reflected the longus colli from C5 to C6 and placed self retaining retractors. I opened the disc space(s) at C5/6 with a 15 blade. I removed disc with curettes, Kerrison punches, and the drill. Using the drill I removed osteophytes and prepared for the decompression.  I decompressed the spinal canal and the C6 root(s) with the drill, Kerrison  punches, and the curettes. I used the microscope to aid in microdissection. I removed the posterior longitudinal ligament to fully expose and decompress the thecal sac. I exposed the roots laterally taking down the C5/6 uncovertebral joints. With the decompression complete we moved on to the arthroplasty. I used the drill to level the surfaces of C5 and 6. I removed soft tissue to prepare the disc space and the bony surfaces. I measured the space and placed a 5x87mm cervical disc into the disc space.   I irrigated the wound, achieved hemostasis, and closed the wound in layers. We approximated the platysma, and the subcuticular plane with vicryl sutures. I used Dermabond for a sterile dressing.   PLAN OF CARE: Admit for overnight observation  PATIENT DISPOSITION:  PACU - hemodynamically stable.   Delay start of Pharmacological VTE agent (>24hrs) due to surgical blood loss or risk of bleeding:  yes

## 2017-10-20 NOTE — Anesthesia Postprocedure Evaluation (Signed)
Anesthesia Post Note  Patient: Ashley Paul  Procedure(s) Performed: ARTIFICIAL DISC REPLACEMENT CERVICAL FIVE- CERVICAL SIX (N/A Neck)     Patient location during evaluation: PACU Anesthesia Type: General Level of consciousness: awake and alert Pain management: pain level controlled Vital Signs Assessment: post-procedure vital signs reviewed and stable Respiratory status: spontaneous breathing, nonlabored ventilation and respiratory function stable Cardiovascular status: blood pressure returned to baseline and stable Postop Assessment: no apparent nausea or vomiting Anesthetic complications: no    Last Vitals:  Vitals:   10/20/17 1406 10/20/17 1415  BP: 130/82   Pulse: (!) 110 (!) 107  Resp: 17 16  Temp:  36.7 C  SpO2: 93% 94%    Last Pain:  Vitals:   10/20/17 1400  TempSrc:   PainSc: Asleep                 Lynda Rainwater

## 2017-10-20 NOTE — Progress Notes (Signed)
Discharged instructions/education/prescription/AVS given to patient and verbalized understanding. Pain is mild to moderate and controlled by PRN meds. Swallowing with no difficulty. Emptying her bladder well. MAE,Walked in the hallway with no problem. No drainage, no swelling, no redness noted on incision site. Discharged patient via wheelchair.

## 2017-10-20 NOTE — H&P (Signed)
LMP 10/07/2017  HOPI: Ashley Paul comes in today for evaluation of pain that she has on the right side of her neck and across her shoulder, mainly on the right side. She has had this now for over a year. She did have some tingling back in 09/2015, and according to her husband due to stubbornness, just did not see a physician until most recently. She had worked with a Restaurant manager, fast food, he did do physical therapy in and around the cervical spine, but it just did not help enough. She has absolutely no bowel or bladder dysfunction or weakness. No upper extremity symptoms. 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 She is 46 years of age. She is right-handed. REVIEW OF SYSTEMS: Review of systems is positive for neck pain, arm pain, and thyroid disease, she had a goiter. Weight has been steady. CURRENT MEDICAL PROBLEMS: She does have some headaches, numbness and tingling in the right upper extremity. PAST MEDICAL HISTORY: She has a history of hypertension. PAST SURGICAL HISTORY: She has undergone both cesarean section and a cholecystectomy. SOCIAL HISTORY: She does not use alcohol. She does not smoke.  FAMILY HISTORY: Mother is 48. Father is 55. Both parents are in good health. ALLERGIES: SHE HAS AN ALLERGY TO WELLBUTRIN WHICH CAUSES HIVES. CURRENT MEDICATIONS: She tales Losartan for her high blood pressure.  PHYSICAL EXAMINATION: Vital signs, height 5 feet 8 inches, weight 230 pounds, temperature is 98.6 Fahrenheit, blood pressure is 127/87, pulse is 84, pain is 8/10.  On examination she is alert, oriented x4, and answering all questions appropriately. Pupils are equal, round, and reactive to light. Full extraocular movements. Full visual fields. Symmetric facial sensation and movement. Hearing is intact to finger rub. Uvula elevates in the midline. Shoulder shrug is normal. Tongue protrudes in the midline. 5/5 strength in the  upper and lower extremities. Intact proprioception in the upper and lower extremities. 2+ reflexes upper and lower extremities. Normal gait, negative Romberg. Tandem walk performed with ease. Memory, language, attention span, and fund of knowledge is normal. [name] returns today with an MRI of the cervical spine. What it shows is that she has a fairly large osteophyte at C5-6 compressing both the right. She has osteophyte on the left and 1 on the right compression both C6 roots. I do believe this is the reason for her pain. She would not like to proceed with any kind of procedure until the end of October. We will go ahead and get her scheduled for this. I would like to do a cervical arthroplasty, since she has single-level disease and it would preserve motion. If not, a fusion can be done very safely and relieve her of the symptoms I believe. Risks and benefits of bleeding, infection, no relief, spinal cord damage, paralysis, weakness of 1 or both upper extremities were described. She understands and wishes to proceed.

## 2017-10-20 NOTE — Discharge Instructions (Signed)

## 2017-10-20 NOTE — Anesthesia Preprocedure Evaluation (Signed)
Anesthesia Evaluation  Patient identified by MRN, date of birth, ID band Patient awake    Reviewed: Allergy & Precautions, NPO status , Patient's Chart, lab work & pertinent test results  Airway Mallampati: II  TM Distance: >3 FB Neck ROM: Full    Dental no notable dental hx.    Pulmonary neg pulmonary ROS, former smoker,    Pulmonary exam normal breath sounds clear to auscultation       Cardiovascular hypertension, Pt. on medications negative cardio ROS Normal cardiovascular exam Rhythm:Regular Rate:Normal     Neuro/Psych Anxiety Depression negative neurological ROS  negative psych ROS   GI/Hepatic negative GI ROS, Neg liver ROS, GERD  ,  Endo/Other  negative endocrine ROS  Renal/GU negative Renal ROS  negative genitourinary   Musculoskeletal negative musculoskeletal ROS (+) Arthritis ,   Abdominal (+) + obese,   Peds negative pediatric ROS (+)  Hematology negative hematology ROS (+)   Anesthesia Other Findings   Reproductive/Obstetrics negative OB ROS                             Anesthesia Physical Anesthesia Plan  ASA: II  Anesthesia Plan: General   Post-op Pain Management:    Induction: Intravenous  PONV Risk Score and Plan: 3 and Ondansetron, Dexamethasone and Midazolam  Airway Management Planned: Oral ETT  Additional Equipment:   Intra-op Plan:   Post-operative Plan: Extubation in OR  Informed Consent: I have reviewed the patients History and Physical, chart, labs and discussed the procedure including the risks, benefits and alternatives for the proposed anesthesia with the patient or authorized representative who has indicated his/her understanding and acceptance.   Dental advisory given  Plan Discussed with: CRNA  Anesthesia Plan Comments:         Anesthesia Quick Evaluation

## 2017-10-20 NOTE — Discharge Summary (Signed)
Physician Discharge Summary  Patient ID: Ashley Paul MRN: [id number] DOB/AGE: [birth year] 46 y.o.  Admit date: 10/20/2017 Discharge date: 10/20/2017  Admission Diagnoses:Osteoarthritis with radiculopathy, C5/6  Discharge Diagnoses:  Active Problems:   Osteoarthritis of spine with radiculopathy, cervical region   Discharged Condition: good  Hospital Course: Mrs. Odle was taken to the operating room and underwent a cervical arthroplasty(medtronic prestige)at C5/6. She is eating, ambulating, and voiding without difficulty. Her wound is clean, dry, and without signs of infection. Strength is full in all extremities. Voice is strong.   Treatments: surgery: Cervical arthroplasty C5/6  Discharge Exam: Blood pressure 117/60, pulse (!) 113, temperature 98.3 F (36.8 C), resp. rate 18, height 5\' 8"  (1.727 m), weight 108.9 kg (240 lb), last menstrual period 10/07/2017, SpO2 94 %. General appearance: alert, cooperative, appears stated age and mild distress Neurologic: Alert and oriented X 3, normal strength and tone. Normal symmetric reflexes. Normal coordination and gait  Disposition: 01-Home or Self Care OSTEOARTHRITIS OF SPINE WITH RADICULOPATHY, CERVICAL REGION  Allergies as of 10/20/2017      Reactions   Azathioprine Other (See Comments)   Flu-like symptoms, tachycardia   Bupropion Hives   Lisinopril Cough   Mycophenolate Mofetil Hives   Sulfa Antibiotics Swelling   SWELLING REACTION UNSPECIFIED    Pantoprazole Sodium Palpitations   Tramadol Anxiety   Severe anxiety      Medication List    TAKE these medications   albuterol 108 (90 Base) MCG/ACT inhaler Commonly known as:  PROVENTIL HFA;VENTOLIN HFA Inhale 1-2 puffs into the lungs every 6 (six) hours as needed for wheezing or shortness of breath.   HYDROcodone-acetaminophen 5-325 MG tablet Commonly known as:  NORCO/VICODIN Take 1 tablet by mouth every 6 (six) hours as needed for moderate pain.   ibuprofen 200 MG  tablet Commonly known as:  ADVIL,MOTRIN Take 800 mg by mouth every 6 (six) hours as needed for headache or moderate pain.   losartan-hydrochlorothiazide 50-12.5 MG tablet Commonly known as:  HYZAAR Take 1 tablet by mouth daily.   naproxen sodium 220 MG tablet Commonly known as:  ANAPROX Take 440 mg by mouth 2 (two) times daily as needed (for headache or pain).   omeprazole 20 MG capsule Commonly known as:  PRILOSEC Take 1 capsule (20 mg total) by mouth daily before breakfast.   sertraline 25 MG tablet Commonly known as:  ZOLOFT Take 25 mg by mouth daily.   tiZANidine 4 MG tablet Commonly known as:  ZANAFLEX Take 1 tablet (4 mg total) by mouth every 6 (six) hours as needed for muscle spasms.      Follow-up Information    Ashok Pall, MD Follow up in 3 week(s).   Specialty:  Neurosurgery Why:  please call to make an appointment Contact information: [address] [place] [address] [phone]           Signed: Winfield Cunas 10/20/2017, 6:33 PM

## 2017-10-25 ENCOUNTER — Encounter (HOSPITAL_COMMUNITY): Payer: Self-pay | Admitting: Neurosurgery

## 2017-11-03 DIAGNOSIS — L2089 Other atopic dermatitis: Secondary | ICD-10-CM | POA: Diagnosis not present

## 2017-11-10 DIAGNOSIS — F411 Generalized anxiety disorder: Secondary | ICD-10-CM | POA: Diagnosis not present

## 2017-11-10 DIAGNOSIS — E559 Vitamin D deficiency, unspecified: Secondary | ICD-10-CM | POA: Diagnosis not present

## 2017-11-10 DIAGNOSIS — R7303 Prediabetes: Secondary | ICD-10-CM | POA: Diagnosis not present

## 2017-11-11 DIAGNOSIS — M4722 Other spondylosis with radiculopathy, cervical region: Secondary | ICD-10-CM | POA: Diagnosis not present

## 2017-11-11 DIAGNOSIS — M542 Cervicalgia: Secondary | ICD-10-CM | POA: Diagnosis not present

## 2017-11-30 DIAGNOSIS — J309 Allergic rhinitis, unspecified: Secondary | ICD-10-CM | POA: Diagnosis not present

## 2017-11-30 DIAGNOSIS — R062 Wheezing: Secondary | ICD-10-CM | POA: Diagnosis not present

## 2017-11-30 DIAGNOSIS — F419 Anxiety disorder, unspecified: Secondary | ICD-10-CM | POA: Diagnosis not present

## 2017-11-30 DIAGNOSIS — E041 Nontoxic single thyroid nodule: Secondary | ICD-10-CM | POA: Diagnosis not present

## 2018-01-11 ENCOUNTER — Ambulatory Visit (INDEPENDENT_AMBULATORY_CARE_PROVIDER_SITE_OTHER): Payer: Self-pay | Admitting: Nurse Practitioner

## 2018-01-11 ENCOUNTER — Encounter: Payer: Self-pay | Admitting: Nurse Practitioner

## 2018-01-11 VITALS — BP 122/88 | HR 89 | Temp 97.8°F | Resp 22 | Wt 240.0 lb

## 2018-01-11 DIAGNOSIS — L259 Unspecified contact dermatitis, unspecified cause: Secondary | ICD-10-CM

## 2018-01-11 MED ORDER — HYDROCORTISONE 2.5 % EX OINT: TOPICAL_OINTMENT | Freq: Two times a day (BID) | CUTANEOUS | 0 refills | Status: AC

## 2018-01-11 MED ORDER — PREDNISONE 10 MG (21) PO TBPK: ORAL_TABLET | ORAL | 0 refills | Status: AC

## 2018-01-11 NOTE — Patient Instructions (Addendum)

## 2018-01-11 NOTE — Progress Notes (Signed)
Subjective:     Ashley Paul is a 47 y.o. female who presents for evaluation of a rash involving the left forearm and right shoulder/upper arm. Rash started 2 weeks ago. Lesions are erythematous, and some areas of the rash are raised due to patient scratching and others are flat in texture. Rash has changed over time. Rash is pruritic. Associated symptoms: none. Patient denies: abdominal pain, congestion, cough, fever, nausea and vomiting. Patient has had contacts with similar rash. Patient has not had new exposures (soaps, lotions, laundry detergents, foods, medications, plants, insects or animals).  The following portions of the patient's history were reviewed and updated as appropriate: allergies, current medications and past medical history.  Review of Systems Constitutional: negative Ears, nose, mouth, throat, and face: negative Respiratory: negative Cardiovascular: negative Integument/breast: positive for pruritus and rash, negative for skin lesion(s) Behavioral/Psych: negative    Objective:    BP 122/88 (BP Location: Right Arm, Patient Position: Sitting, Cuff Size: Normal)   Pulse 89   Temp 97.8 F (36.6 C) (Oral)   Resp (!) 22   Wt 240 lb (108.9 kg)   SpO2 98%   BMI 36.49 kg/m  General:  alert, cooperative, mild distress and due to itching  Skin:  rash noted on left forearm and right shoulder, erythematous base, scabbed, dried papules to left forearm. flat erythematous rash on right shoulder     Assessment:    contact dermatitis: Unknown Agent    Plan:    Medications: hydrocortisone and Sterapred Dose Pack.. Written patient instruction given.  Patient instructed to use Benadryl at bedtime for itching.  Colloidal oatmeal baths.  Follow up with Dermatology.

## 2018-01-13 ENCOUNTER — Telehealth: Payer: Self-pay

## 2018-01-17 ENCOUNTER — Other Ambulatory Visit: Payer: Self-pay | Admitting: Otolaryngology

## 2018-01-17 DIAGNOSIS — E041 Nontoxic single thyroid nodule: Secondary | ICD-10-CM | POA: Diagnosis not present

## 2018-01-24 ENCOUNTER — Ambulatory Visit
Admission: RE | Admit: 2018-01-24 | Discharge: 2018-01-24 | Disposition: A | Discharge status: Home / Self Care | Payer: 59 | Source: Ambulatory Visit | Attending: Otolaryngology | Admitting: Otolaryngology

## 2018-01-24 DIAGNOSIS — E042 Nontoxic multinodular goiter: Secondary | ICD-10-CM | POA: Diagnosis not present

## 2018-01-24 DIAGNOSIS — E041 Nontoxic single thyroid nodule: Secondary | ICD-10-CM

## 2018-01-27 ENCOUNTER — Other Ambulatory Visit: Payer: Self-pay | Admitting: Otolaryngology

## 2018-01-27 DIAGNOSIS — E041 Nontoxic single thyroid nodule: Secondary | ICD-10-CM

## 2018-02-02 ENCOUNTER — Ambulatory Visit
Admission: RE | Admit: 2018-02-02 | Discharge: 2018-02-02 | Disposition: A | Discharge status: Home / Self Care | Payer: 59 | Source: Ambulatory Visit | Attending: Otolaryngology | Admitting: Otolaryngology

## 2018-02-02 DIAGNOSIS — E041 Nontoxic single thyroid nodule: Secondary | ICD-10-CM | POA: Diagnosis not present

## 2018-02-02 NOTE — Discharge Instructions (Signed)
Thyroid Biopsy, Care After °Refer to this sheet in the next few weeks. These instructions provide you with information on caring for yourself after your procedure. Your health care provider may also give you more specific instructions. Your treatment has been planned according to current medical practices, but problems sometimes occur. Call your health care provider if you have any problems or questions after your procedure. °What can I expect after the procedure? °After your procedure, it is typical to have the following: °· You may have soreness and tenderness at the biopsy site for a few days. °· You may have a sore throat or a hoarse voice if you had an open biopsy. This should go away after a couple days. ° °Follow these instructions at home: °· Take medicines only as directed by your health care provider. °· To ease discomfort at the biopsy site: °? Keep your head raised on a pillow when you are lying down. °? Support the back of your head and neck with both hands as you sit up from a lying position. °· If you have a sore throat, try using throat lozenges or gargling with warm salt water. °· Keep all follow-up visits as directed by your health care provider. This is important. °Contact a health care provider if: °· You have a fever. °Get help right away if: °· You have severe bleeding from the biopsy site. °· You have difficulty swallowing. °· You have drainage, redness, swelling, or pain at the biopsy site. °· You have swollen glands (lymph nodes) in your neck. °This information is not intended to replace advice given to you by your health care provider. Make sure you discuss any questions you have with your health care provider. °Document Released: 07/11/2014 Document Revised: 08/16/2016 Document Reviewed: 03/08/2014 °Elsevier Interactive Patient Education © 2018 [employer] Inc[employer] ° °

## 2018-02-02 NOTE — Procedures (Signed)
Interventional Radiology Procedure Note  Procedure: US guided FNA of right thyroid nodule, with AFIRMA  Complications: None Recommendations:  - Ok to shower tomorrow - Do not submerge for 7 days - Routine care   Signed,  Dulcy Fanny. Earleen Newport, DO

## 2018-02-03 DIAGNOSIS — L2089 Other atopic dermatitis: Secondary | ICD-10-CM | POA: Diagnosis not present

## 2018-02-04 LAB — CYTOLOGY - NON PAP

## 2018-02-16 ENCOUNTER — Ambulatory Visit
Admission: RE | Admit: 2018-02-16 | Discharge: 2018-02-16 | Disposition: A | Discharge status: Home / Self Care | Payer: 59 | Source: Ambulatory Visit | Attending: Family Medicine | Admitting: Family Medicine

## 2018-02-16 DIAGNOSIS — Z1231 Encounter for screening mammogram for malignant neoplasm of breast: Secondary | ICD-10-CM | POA: Diagnosis not present

## 2018-02-18 ENCOUNTER — Other Ambulatory Visit: Payer: Self-pay | Admitting: Family Medicine

## 2018-02-18 DIAGNOSIS — R928 Other abnormal and inconclusive findings on diagnostic imaging of breast: Secondary | ICD-10-CM

## 2018-02-18 DIAGNOSIS — N6489 Other specified disorders of breast: Secondary | ICD-10-CM

## 2018-03-03 ENCOUNTER — Other Ambulatory Visit: Payer: 59

## 2018-03-03 ENCOUNTER — Ambulatory Visit: Payer: 59

## 2018-03-09 ENCOUNTER — Ambulatory Visit
Admission: RE | Admit: 2018-03-09 | Discharge: 2018-03-09 | Disposition: A | Discharge status: Home / Self Care | Payer: 59 | Source: Ambulatory Visit | Attending: Family Medicine | Admitting: Family Medicine

## 2018-03-09 DIAGNOSIS — N6001 Solitary cyst of right breast: Secondary | ICD-10-CM | POA: Diagnosis not present

## 2018-03-09 DIAGNOSIS — N6489 Other specified disorders of breast: Secondary | ICD-10-CM | POA: Insufficient documentation

## 2018-03-09 DIAGNOSIS — R928 Other abnormal and inconclusive findings on diagnostic imaging of breast: Secondary | ICD-10-CM

## 2018-04-14 ENCOUNTER — Other Ambulatory Visit: Payer: Self-pay | Admitting: Nurse Practitioner

## 2018-04-21 DIAGNOSIS — M791 Myalgia, unspecified site: Secondary | ICD-10-CM | POA: Diagnosis not present

## 2018-04-21 DIAGNOSIS — I10 Essential (primary) hypertension: Secondary | ICD-10-CM | POA: Diagnosis not present

## 2018-04-21 DIAGNOSIS — M2550 Pain in unspecified joint: Secondary | ICD-10-CM | POA: Diagnosis not present

## 2018-04-21 DIAGNOSIS — E559 Vitamin D deficiency, unspecified: Secondary | ICD-10-CM | POA: Diagnosis not present

## 2018-06-02 DIAGNOSIS — M5416 Radiculopathy, lumbar region: Secondary | ICD-10-CM | POA: Diagnosis not present

## 2018-06-03 DIAGNOSIS — M5416 Radiculopathy, lumbar region: Secondary | ICD-10-CM | POA: Insufficient documentation

## 2018-06-26 ENCOUNTER — Telehealth: Payer: 59 | Admitting: Physician Assistant

## 2018-06-26 ENCOUNTER — Ambulatory Visit
Admission: EM | Admit: 2018-06-26 | Discharge: 2018-06-26 | Disposition: A | Discharge status: Home / Self Care | Payer: 59

## 2018-06-26 DIAGNOSIS — H10022 Other mucopurulent conjunctivitis, left eye: Secondary | ICD-10-CM

## 2018-06-26 MED ORDER — POLYMYXIN B-TRIMETHOPRIM 10000-0.1 UNIT/ML-% OP SOLN: 1.0000 [drp] | Freq: Four times a day (QID) | OPHTHALMIC | 0 refills | Status: DC

## 2018-06-26 NOTE — Progress Notes (Signed)

## 2018-07-08 DIAGNOSIS — F411 Generalized anxiety disorder: Secondary | ICD-10-CM | POA: Diagnosis not present

## 2018-07-21 ENCOUNTER — Telehealth: Payer: 59 | Admitting: Family

## 2018-07-21 DIAGNOSIS — J028 Acute pharyngitis due to other specified organisms: Secondary | ICD-10-CM | POA: Diagnosis not present

## 2018-07-21 DIAGNOSIS — B9689 Other specified bacterial agents as the cause of diseases classified elsewhere: Secondary | ICD-10-CM

## 2018-07-21 MED ORDER — BENZONATATE 100 MG PO CAPS: 100.0000 mg | ORAL_CAPSULE | Freq: Three times a day (TID) | ORAL | 0 refills | Status: DC | PRN

## 2018-07-21 MED ORDER — AZITHROMYCIN 250 MG PO TABS: ORAL_TABLET | ORAL | 0 refills | Status: DC

## 2018-07-21 MED ORDER — PREDNISONE 5 MG PO TABS: 5.0000 mg | ORAL_TABLET | ORAL | 0 refills | Status: DC

## 2018-07-21 MED FILL — BENZONATATE 100 MG CAPS: 100 | 5 days supply | Qty: 30 | Fill #0

## 2018-07-21 MED FILL — predniSONE 5 MG TABS: 5 | 6 days supply | Qty: 21 | Fill #0

## 2018-07-21 MED FILL — AZITHROMYCIN 250 MG TABLET: 250 | 5 days supply | Qty: 6 | Fill #0

## 2018-07-21 NOTE — Progress Notes (Signed)

## 2018-08-17 ENCOUNTER — Telehealth: Payer: 59 | Admitting: Nurse Practitioner

## 2018-08-17 DIAGNOSIS — R05 Cough: Secondary | ICD-10-CM | POA: Diagnosis not present

## 2018-08-17 DIAGNOSIS — R059 Cough, unspecified: Secondary | ICD-10-CM

## 2018-08-17 MED ORDER — PREDNISONE 20 MG PO TABS: ORAL_TABLET | ORAL | 0 refills | Status: DC

## 2018-08-17 NOTE — Progress Notes (Signed)

## 2018-09-16 ENCOUNTER — Telehealth: Payer: 59 | Admitting: Family

## 2018-09-16 DIAGNOSIS — J028 Acute pharyngitis due to other specified organisms: Secondary | ICD-10-CM | POA: Diagnosis not present

## 2018-09-16 DIAGNOSIS — B9689 Other specified bacterial agents as the cause of diseases classified elsewhere: Secondary | ICD-10-CM | POA: Diagnosis not present

## 2018-09-16 MED ORDER — AZITHROMYCIN 250 MG PO TABS: ORAL_TABLET | ORAL | 0 refills | Status: DC

## 2018-09-16 MED ORDER — PREDNISONE 5 MG PO TABS: 5.0000 mg | ORAL_TABLET | ORAL | 0 refills | Status: DC

## 2018-09-16 MED ORDER — BENZONATATE 100 MG PO CAPS: 100.0000 mg | ORAL_CAPSULE | Freq: Three times a day (TID) | ORAL | 0 refills | Status: DC | PRN

## 2018-09-16 NOTE — Progress Notes (Signed)

## 2018-09-21 ENCOUNTER — Telehealth: Payer: 59 | Admitting: Family Medicine

## 2018-09-21 DIAGNOSIS — Z8709 Personal history of other diseases of the respiratory system: Secondary | ICD-10-CM

## 2018-09-21 NOTE — Progress Notes (Signed)
Based on what you shared with me it looks like you have a condition that should be evaluated in a face to face office visit.  My concern is your recent E-visit where you were treated with antibiotics and prednisone on 09/16/18 but today are symptomatic- I apologize for the inconvenience I believe it is best you are evaluated face to face.   NOTE: If you entered your credit card information for this eVisit, you will not be charged. You may see a "hold" on your card for the $30 but that hold will drop off and you will not have a charge processed.  If you are having a true medical emergency please call 911.  If you need an urgent face to face visit,  has four urgent care centers for your convenience.  If you need care fast and have a high deductible or no insurance consider:   [email or web address] to reserve your spot online an avoid wait times  [facility] [address] [place], [place] [address] 8 am to 8 pm Monday-Friday 10 am to 4 pm Saturday-Sunday *Across the street from [employer]  [place], [phone] 8 am to 5 pm Monday-Friday * In the [facility] on the [facility]   The following sites will take your  insurance:  . [facility] [place] a Provider at this Location  [address] [place], [place] [address] . 10 am to 8 pm Monday-Friday . 12 pm to 8 pm Saturday-Sunday   . [facility] [place] a Provider at this Location  [address] [place] [place], [place], [place] [address] . 8 am to 8 pm Monday-Friday . 9 am to 6 pm Saturday . 11 am to 6 pm Sunday   . [facility] [place] Get Driving Directions  [address] [place], [place] [address] . 8 am to 8 pm Monday-Friday . 8 am to 4 pm  Saturday-Sunday   Your e-visit answers were reviewed by a board certified advanced clinical practitioner to complete your personal care plan.  Thank you for using e-Visits.

## 2018-09-21 NOTE — Progress Notes (Signed)
Based on what you shared with me it looks like you have a serious condition that should be evaluated in a face to face office visit.  NOTE: If you entered your credit card information for this eVisit, you will not be charged. You may see a "hold" on your card for the $30 but that hold will drop off and you will not have a charge processed.  If you are having a true medical emergency please call 911.  If you need an urgent face to face visit, [facility] has four urgent care centers for your convenience.  If you need care fast and have a high deductible or no insurance consider:   [email or web address] to reserve your spot online an avoid wait times  [facility] [address], [address] [place], [place] [address] 8 am to 8 pm Monday-Friday 10 am to 4 pm Saturday-Sunday *Across the street from [employer]  [facility]  [address] [place], [address] 8 am to 5 pm Monday-Friday * In the [facility] on the [facility]   The following sites will take your  insurance:  . [facility]  [phone] Get Driving Directions Find a Provider at this Location  [address] [place], [place] [address] . 10 am to 8 pm Monday-Friday . 12 pm to 8 pm Saturday-Sunday   . [facility]  [phone] Get Driving Directions Find a Provider at this Location  [address], [address] [place], [place] [address] . 8 am to 8 pm Monday-Friday . 9 am to 6 pm Saturday . 11 am to 6 pm Sunday   . [facility] [place]  [phone] Get Driving Directions  [address] [place], [place] [address] . 8 am to 8 pm Monday-Friday . 8 am to 4 pm Saturday-Sunday   Your e-visit answers were reviewed by a board certified advanced clinical practitioner to complete your personal care plan.  Thank you for using e-Visits.  

## 2018-09-26 DIAGNOSIS — H5213 Myopia, bilateral: Secondary | ICD-10-CM | POA: Diagnosis not present

## 2018-09-27 ENCOUNTER — Other Ambulatory Visit: Payer: Self-pay | Admitting: Surgery

## 2018-09-29 ENCOUNTER — Ambulatory Visit: Payer: 59 | Admitting: Skilled Nursing Facility1

## 2018-10-17 ENCOUNTER — Other Ambulatory Visit: Payer: Self-pay | Admitting: Internal Medicine

## 2018-10-21 ENCOUNTER — Ambulatory Visit
Admission: RE | Admit: 2018-10-21 | Discharge: 2018-10-21 | Disposition: A | Discharge status: Home / Self Care | Payer: 59 | Source: Ambulatory Visit | Attending: Surgery | Admitting: Surgery

## 2018-10-21 DIAGNOSIS — Z01810 Encounter for preprocedural cardiovascular examination: Secondary | ICD-10-CM | POA: Insufficient documentation

## 2018-10-21 DIAGNOSIS — Z01818 Encounter for other preprocedural examination: Secondary | ICD-10-CM | POA: Diagnosis not present

## 2018-10-25 IMAGING — US US FNA BIOPSY THYROID 1ST LESION
1 series · 11 of 11 positions shown · non-contrast
Comparison: 01/24/2018

MEDICATIONS:
None

COMPLICATIONS:
None

INDICATION: 46-year-old female with a history of thyroid nodule

EXAM:
ULTRASOUND GUIDED FINE NEEDLE ASPIRATION OF INDETERMINATE THYROID
NODULE
TECHNIQUE: Informed written consent was obtained from the patient after a
discussion of the risks, benefits and alternatives to treatment.
Questions regarding the procedure were encouraged and answered. A
timeout was performed prior to the initiation of the procedure.

[Series 1: us fna biopsy thyroid 1st lesion · 0.07mm/px · 11 of 11 slices shown]
[im 1/11]
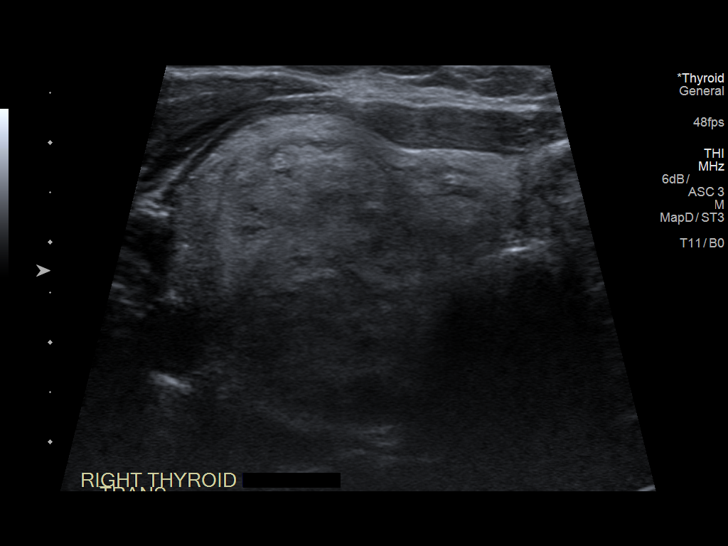
[im 2/11]
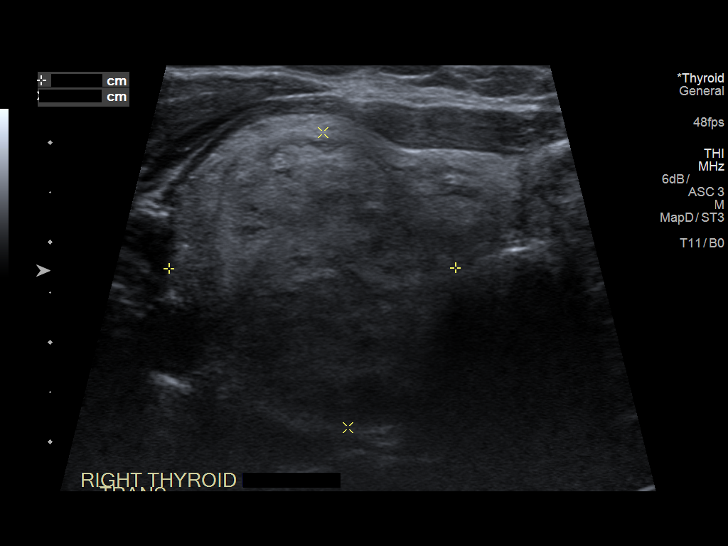
[im 3/11]
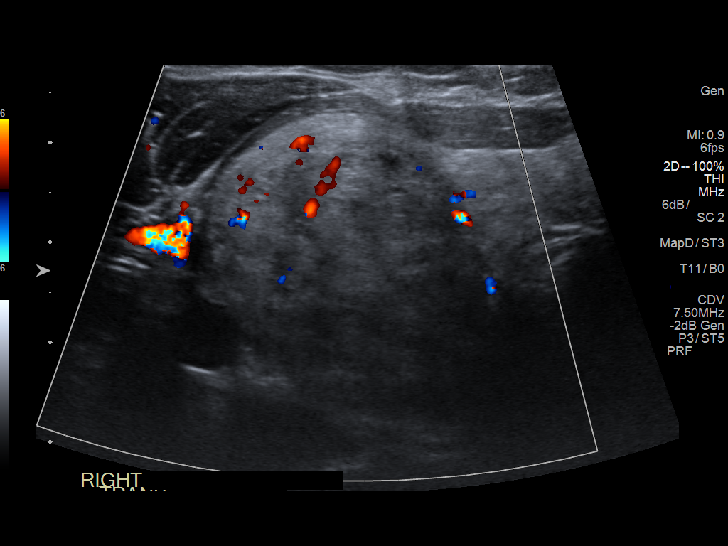
[im 4/11]
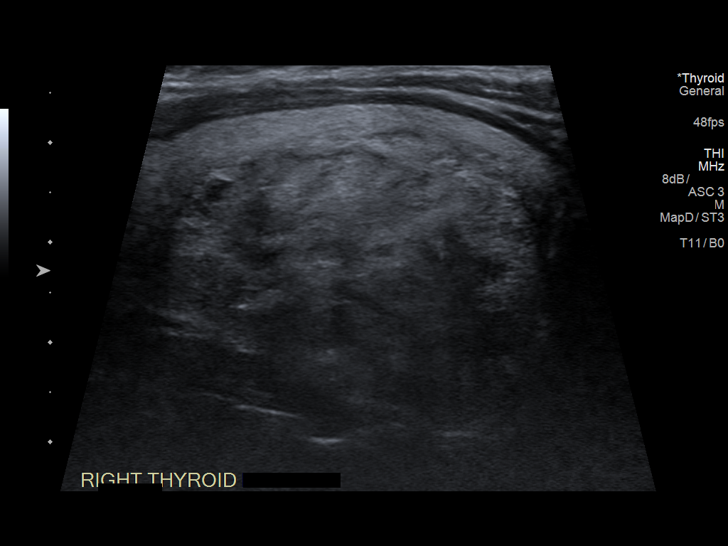
[im 5/11]
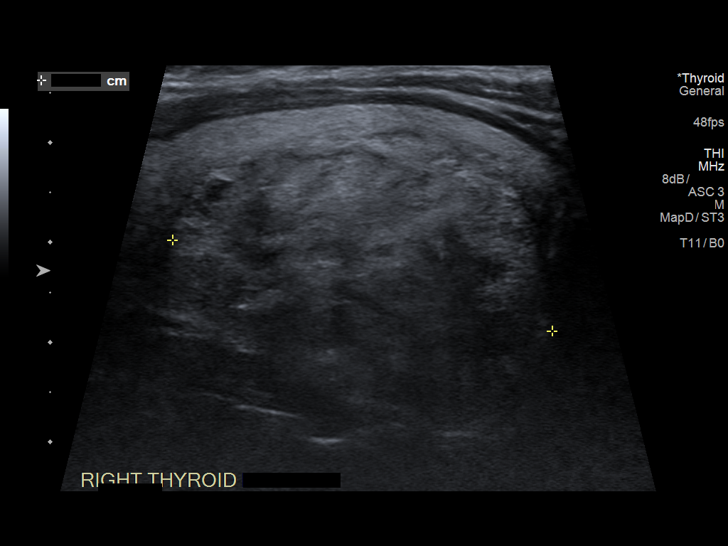
[im 6/11]
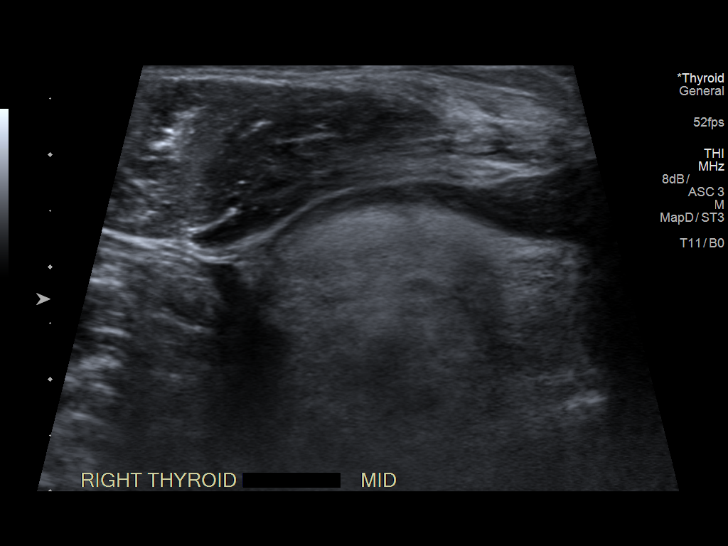
[im 7/11]
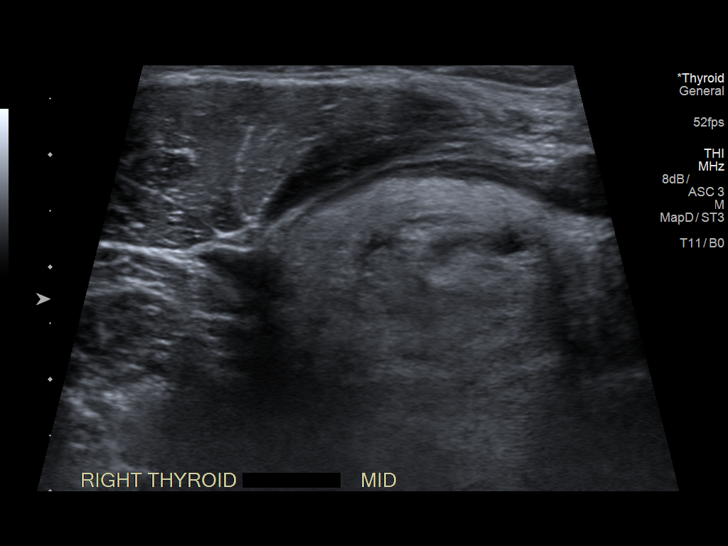
[im 8/11]
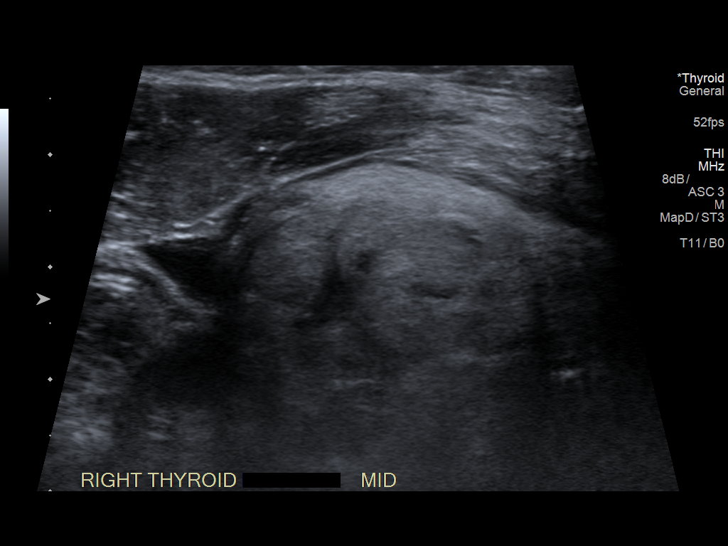
[im 9/11]
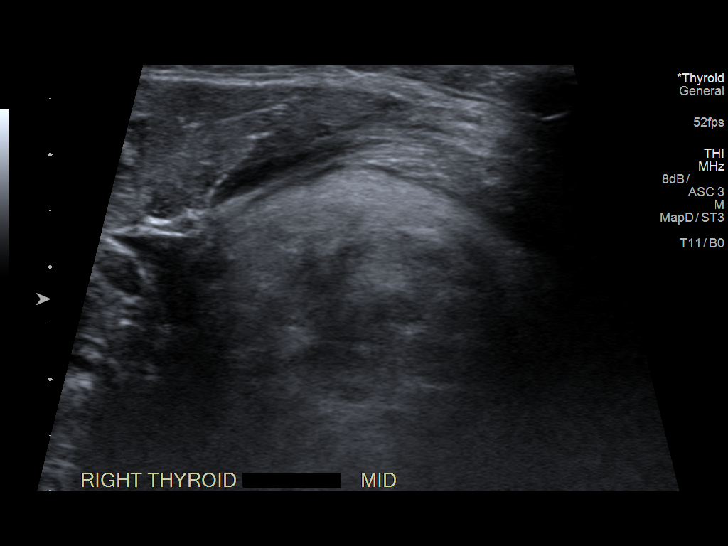
[im 10/11]
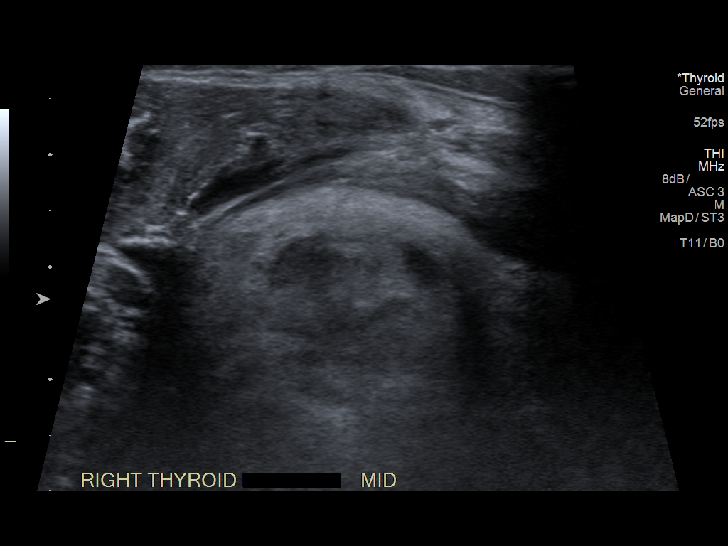
[im 11/11]
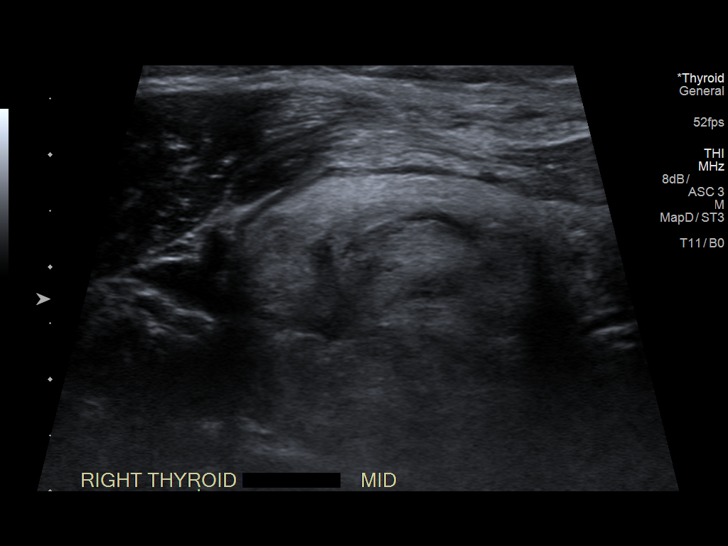

[11 of 11 positions shown; findings below may reference images not displayed]

Pre-procedural ultrasound scanning demonstrated unchanged size and
appearance of the indeterminate nodule within the right mid thyroid
nodule

The procedure was planned. The neck was prepped in the usual sterile
fashion, and a sterile drape was applied covering the operative
field. A timeout was performed prior to the initiation of the
procedure. Local anesthesia was provided with 1% lidocaine.

Under direct ultrasound guidance, 3 FNA biopsies were performed of
the right mid with a 25 gauge needle. Multiple ultrasound images
were saved for procedural documentation purposes. The samples were
prepared and submitted to pathology. Two additional biopsy were
performed for AFIRMA testing.

Limited post procedural scanning was negative for hematoma or
additional complication. Dressings were placed. The patient
tolerated the above procedures procedure well without immediate
postprocedural complication.
FINDINGS: Nodule reference number based on prior diagnostic ultrasound: 2

Maximum size:

Location: Right; Mid

ACR TI-RADS risk category: TR4 (4-6 points)

Reason for biopsy: meets ACR TI-RADS criteria

Ultrasound imaging confirms appropriate placement of the needles
within the thyroid nodule.
IMPRESSION: Status post biopsy of right mid thyroid nodule.

## 2018-10-27 ENCOUNTER — Telehealth: Payer: 59 | Admitting: Family

## 2018-10-27 DIAGNOSIS — R21 Rash and other nonspecific skin eruption: Secondary | ICD-10-CM | POA: Diagnosis not present

## 2018-10-27 DIAGNOSIS — B9689 Other specified bacterial agents as the cause of diseases classified elsewhere: Secondary | ICD-10-CM | POA: Diagnosis not present

## 2018-10-27 DIAGNOSIS — J028 Acute pharyngitis due to other specified organisms: Secondary | ICD-10-CM | POA: Diagnosis not present

## 2018-10-27 MED ORDER — PREDNISONE 5 MG PO TABS: 5.0000 mg | ORAL_TABLET | ORAL | 0 refills | Status: DC

## 2018-10-27 MED ORDER — CLINDAMYCIN PHOSPHATE 1 % EX GEL
Freq: Two times a day (BID) | CUTANEOUS | 0 refills | Status: DC
Start: 2018-10-27 — End: 2019-02-27

## 2018-10-27 NOTE — Progress Notes (Signed)
Thank you for the details you included in the comment boxes. Those details are very helpful in determining the best course of treatment for you and help [place] to provide the best care. This looks more like bacterial foliculitis (bacterial infection of the skin) than anything else. Rashes are always a challenge as we often cannot determine the origin. If the hydrocortizone is not helping, it would not help bacterial rashes. If it is helping, it usually does help with psoriasis and eczema rashes. It can even help with allergic rashes in some cases.   Let's do this. I am going to send a low-dose prednisone pack below. Try that. Additionally, I will send some antibiotic gel to see if this helps as it does look bacterial from the photos. Give the prednisone about 48 hours to see if it is working. If it is not helping much, start using the gel and also finish the prednisone course. See plan below.  E Visit for Rash  We are sorry that you are not feeling well. Here is how we plan to help!   Topical clindamycin  Prednisone 5 mg daily for 6 days (see taper instructions below)  Directions for 6 day taper: Day 1: 2 tablets before breakfast, 1 after both lunch & dinner and 2 at bedtime Day 2: 1 tab before breakfast, 1 after both lunch & dinner and 2 at bedtime Day 3: 1 tab at each meal & 1 at bedtime Day 4: 1 tab at breakfast, 1 at lunch, 1 at bedtime Day 5: 1 tab at breakfast & 1 tab at bedtime Day 6: 1 tab at breakfast     HOME CARE:   Take cool showers and avoid direct sunlight.  Apply cool compress or wet dressings.  Take a bath in an oatmeal bath.  Sprinkle content of one Aveeno packet under running faucet with comfortably warm water.  Bathe for 15-20 minutes, 1-2 times daily.  Pat dry with a towel. Do not rub the rash.  Use hydrocortisone cream.  Take an antihistamine like Benadryl for widespread rashes that itch.  The adult dose of Benadryl is 25-50 mg by mouth 4 times daily.  Caution:   This type of medication may cause sleepiness.  Do not drink alcohol, drive, or operate dangerous machinery while taking antihistamines.  Do not take these medications if you have prostate enlargement.  Read package instructions thoroughly on all medications that you take.  GET HELP RIGHT AWAY IF:   Symptoms don't go away after treatment.  Severe itching that persists.  If you rash spreads or swells.  If you rash begins to smell.  If it blisters and opens or develops a yellow-brown crust.  You develop a fever.  You have a sore throat.  You become short of breath.  MAKE SURE YOU:  Understand these instructions. Will watch your condition. Will get help right away if you are not doing well or get worse.  Thank you for choosing an e-visit. Your e-visit answers were reviewed by a board certified advanced clinical practitioner to complete your personal care plan. Depending upon the condition, your plan could have included both over the counter or prescription medications. Please review your pharmacy choice. Be sure that the pharmacy you have chosen is open so that you can pick up your prescription now.  If there is a problem you may message your provider in [place] to have the prescription routed to another pharmacy. Your safety is important to [place]. If you have drug allergies  check your prescription carefully.  For the next 24 hours, you can use MyChart to ask questions about today's visit, request a non-urgent call back, or ask for a work or school excuse from your e-visit provider. You will get an email in the next two days asking about your experience. I hope that your e-visit has been valuable and will speed your recovery.

## 2018-11-10 ENCOUNTER — Ambulatory Visit: Payer: 59 | Admitting: Skilled Nursing Facility1

## 2018-11-14 ENCOUNTER — Other Ambulatory Visit: Payer: Self-pay | Admitting: Internal Medicine

## 2018-11-15 ENCOUNTER — Telehealth: Payer: 59 | Admitting: Nurse Practitioner

## 2018-11-15 DIAGNOSIS — R21 Rash and other nonspecific skin eruption: Secondary | ICD-10-CM

## 2018-11-15 NOTE — Progress Notes (Signed)
Based on what you shared with me it looks like you have a serious condition that should be evaluated in a face to face office visit.  NOTE: If you entered your credit card information for this eVisit, you will not be charged. You may see a "hold" on your card for the $30 but that hold will drop off and you will not have a charge processed.   * reviewing chart an evisit was done on 10/27/18 with same complaint and was treated with steroid dose pack. Since this did not resolve your issue then you will need a face to face visit for proper diagnosis and care. If you are having a true medical emergency please call 911.  If you need an urgent face to face visit, [facility] has four urgent care centers for your convenience.  If you need care fast and have a high deductible or no insurance consider:   [email or web address] to reserve your spot online an avoid wait times  [facility] [address] [place], [place] [address] 8 am to 8 pm Monday-Friday 10 am to 4 pm Saturday-Sunday *Across the street from [employer]  [place], [address] 8 am to 5 pm Monday-Friday * In the [facility] on the [facility]   The following sites will take your  insurance:  . [facility] Urgent [place] a Provider at this Location  [address] [place], [place] [address] . 10 am to 8 pm Monday-Friday . 12 pm to 8 pm Saturday-Sunday   . [facility] Urgent Care at [place] a Provider at this Location  [place], [place], [place] [address] . 8 am to 8 pm Monday-Friday . 9 am to 6 pm Saturday . 11 am to 6 pm Sunday   . [facility] Health Urgent Care at [place] Get Driving Directions  [address] [place], [place] [address] . 8 am to 8 pm Monday-Friday . 8 am to 4 pm  Saturday-Sunday   Your e-visit answers were reviewed by a board certified advanced clinical practitioner to complete your personal care plan.  Thank you for using e-Visits.

## 2018-11-15 NOTE — Progress Notes (Signed)
As stated earlier- You did evisit on 10/27/18 with same complaint and were treated with prednisone dose pak and antibiotic. If that did  Not help then you need a face to face visit in order to get proper treatment and diagnosis.  Based on what you shared with me it looks like you have a serious condition that should be evaluated in a face to face office visit.  NOTE: If you entered your credit card information for this eVisit, you will not be charged. You may see a "hold" on your card for the $30 but that hold will drop off and you will not have a charge processed.  If you are having a true medical emergency please call 911.  If you need an urgent face to face visit, [facility] has four urgent care centers for your convenience.  If you need care fast and have a high deductible or no insurance consider:   [email or web address] to reserve your spot online an avoid wait times  [facility] [address] [place], [place] [address] 8 am to 8 pm Monday-Friday 10 am to 4 pm Saturday-Sunday *Across the street from [employer]  [place], [address] 8 am to 5 pm Monday-Friday * In the [facility] on the [facility]   The following sites will take your  insurance:  . [facility] Health Urgent [place] a Provider at this Location  [address] [place], [place] [address] . 10 am to 8 pm Monday-Friday . 12 pm to 8 pm Saturday-Sunday   . [facility] Health Urgent Care at [place] a Provider at this Location  [place], [place], [place] [address] . 8 am to 8 pm Monday-Friday . 9 am to 6 pm Saturday . 11 am to 6 pm Sunday   . [facility] Health Urgent Care at [place] Get Driving Directions  [address] [place], [place] [address] . 8 am to 8 pm Monday-Friday . 8 am to 4  pm Saturday-Sunday   Your e-visit answers were reviewed by a board certified advanced clinical practitioner to complete your personal care plan.  Thank you for using e-Visits.

## 2018-11-17 ENCOUNTER — Telehealth: Payer: 59 | Admitting: Family Medicine

## 2018-11-17 DIAGNOSIS — R059 Cough, unspecified: Secondary | ICD-10-CM

## 2018-11-17 DIAGNOSIS — R05 Cough: Secondary | ICD-10-CM

## 2018-11-17 MED ORDER — BENZONATATE 200 MG PO CAPS: 200.0000 mg | ORAL_CAPSULE | Freq: Two times a day (BID) | ORAL | 0 refills | Status: DC | PRN

## 2018-11-17 NOTE — Progress Notes (Signed)
We are sorry that you are not feeling well.  Here is how we plan to help!  Based on your presentation I believe you most likely have A cough due to a virus.  This is called viral bronchitis and is best treated by rest, plenty of fluids and control of the cough.  You may use Ibuprofen or Tylenol as directed to help your symptoms.     In addition you may use A non-prescription cough medication called Robitussin DAC. Take 2 teaspoons every 8 hours or Delsym: take 2 teaspoons every 12 hours. and A prescription cough medication called Tessalon Perles 200mg . You may take 1 capsules every 8 hours as needed for your cough.  If you feel you need additional treatment you should seek face to face care. Please see face to face treatment options below. Thanks.  From your responses in the eVisit questionnaire you describe inflammation in the upper respiratory tract which is causing a significant cough.  This is commonly called Bronchitis and has four common causes:    Allergies  Viral Infections  Acid Reflux  Bacterial Infection Allergies, viruses and acid reflux are treated by controlling symptoms or eliminating the cause. An example might be a cough caused by taking certain blood pressure medications. You stop the cough by changing the medication. Another example might be a cough caused by acid reflux. Controlling the reflux helps control the cough.  USE OF BRONCHODILATOR ("RESCUE") INHALERS: There is a risk from using your bronchodilator too frequently.  The risk is that over-reliance on a medication which only relaxes the muscles surrounding the breathing tubes can reduce the effectiveness of medications prescribed to reduce swelling and congestion of the tubes themselves.  Although you feel brief relief from the bronchodilator inhaler, your asthma may actually be worsening with the tubes becoming more swollen and filled with mucus.  This can delay other crucial treatments, such as oral steroid  medications. If you need to use a bronchodilator inhaler daily, several times per day, you should discuss this with your provider.  There are probably better treatments that could be used to keep your asthma under control.     HOME CARE . Only take medications as instructed by your medical team. . Complete the entire course of an antibiotic. . Drink plenty of fluids and get plenty of rest. . Avoid close contacts especially the very young and the elderly . Cover your mouth if you cough or cough into your sleeve. . Always remember to wash your hands . A steam or ultrasonic humidifier can help congestion.   GET HELP RIGHT AWAY IF: . You develop worsening fever. . You become short of breath . You cough up blood. . Your symptoms persist after you have completed your treatment plan MAKE SURE YOU   Understand these instructions.  Will watch your condition.  Will get help right away if you are not doing well or get worse.  Your e-visit answers were reviewed by a board certified advanced clinical practitioner to complete your personal care plan.  Depending on the condition, your plan could have included both over the counter or prescription medications. If there is a problem please reply  once you have received a response from your provider. Your safety is important to [place].  If you have drug allergies check your prescription carefully.    You can use MyChart to ask questions about today's visit, request a non-urgent call back, or ask for a work or school excuse for 24 hours related  to this e-Visit. If it has been greater than 24 hours you will need to follow up with your provider, or enter a new e-Visit to address those concerns. You will get an e-mail in the next two days asking about your experience.  I hope that your e-visit has been valuable and will speed your recovery. Thank you for using e-visits.    Based on what you shared with me it looks like you have a condition that should be  evaluated in a face to face office visit.  NOTE: If you entered your credit card information for this eVisit, you will not be charged. You may see a "hold" on your card for the $30 but that hold will drop off and you will not have a charge processed.  If you are having a true medical emergency please call 911.  If you need an urgent face to face visit, [facility] has four urgent care centers for your convenience.  If you need care fast and have a high deductible or no insurance consider:   [email or web address] to reserve your spot online an avoid wait times  [facility] [address] [place], [place] [address] 8 am to 8 pm Monday-Friday 10 am to 4 pm Saturday-Sunday *Across the street from [employer]  [place], [address] 8 am to 5 pm Monday-Friday * In the [facility] on the [facility]   The following sites will take your  insurance:  . [facility] Health Urgent [place] a Provider at this Location  [address] [place], [place] [address] . 10 am to 8 pm Monday-Friday . 12 pm to 8 pm Saturday-Sunday   . [facility] Health Urgent Care at [place] a Provider at this Location  [place], [place], [place] [address] . 8 am to 8 pm Monday-Friday . 9 am to 6 pm Saturday . 11 am to 6 pm Sunday   . [facility] Health Urgent Care at [place] Get Driving Directions  [address] [place], [place] [address] . 8 am to 8 pm Monday-Friday . 8 am to 4 pm Saturday-Sunday   Your e-visit answers were reviewed by a board certified advanced clinical practitioner to complete your personal care plan.  Thank you for using e-Visits.

## 2018-11-18 ENCOUNTER — Encounter: Payer: Self-pay | Admitting: Physician Assistant

## 2018-11-18 ENCOUNTER — Ambulatory Visit (INDEPENDENT_AMBULATORY_CARE_PROVIDER_SITE_OTHER): Payer: Self-pay | Admitting: Physician Assistant

## 2018-11-18 VITALS — BP 148/102 | HR 77 | Temp 97.8°F | Wt 253.0 lb

## 2018-11-18 DIAGNOSIS — J209 Acute bronchitis, unspecified: Secondary | ICD-10-CM

## 2018-11-18 DIAGNOSIS — H2513 Age-related nuclear cataract, bilateral: Secondary | ICD-10-CM | POA: Diagnosis not present

## 2018-11-18 MED ORDER — ALBUTEROL SULFATE (2.5 MG/3ML) 0.083% IN NEBU
2.5000 mg | INHALATION_SOLUTION | Freq: Once | RESPIRATORY_TRACT | Status: AC
  Administered 2018-11-18: 2.5 mg via RESPIRATORY_TRACT

## 2018-11-18 MED ORDER — ALBUTEROL SULFATE HFA 108 (90 BASE) MCG/ACT IN AERS: 2.0000 | INHALATION_SPRAY | RESPIRATORY_TRACT | 0 refills | Status: DC | PRN

## 2018-11-18 MED ORDER — MONTELUKAST SODIUM 10 MG PO TABS: 10.0000 mg | ORAL_TABLET | Freq: Every day | ORAL | 0 refills | Status: DC

## 2018-11-18 MED ORDER — PREDNISONE 50 MG PO TABS: 50.0000 mg | ORAL_TABLET | Freq: Every day | ORAL | 0 refills | Status: AC

## 2018-11-18 NOTE — Progress Notes (Signed)
Patient ID: Ashley Paul DOB: 1971/10/03 AGE: 47 y.o. MRN: [id number]   PCP: Sharyne Peach, MD   Chief Complaint:  Chief Complaint  Patient presents with  . choice-wheezing x 1wk     Subjective:    HPI:  Ashley Paul is a 47 y.o. female presents for evaluation  Chief Complaint  Patient presents with  . choice-wheezing x 74wk   47 year old female presents with one week history of cough. States began as phlegm in posterior pharynx sensation. Then developed chest congestion. Yesterday developed cough. Coarse. Feels like it should be productive, but can't cough any sputum up. Associated wheezing (audible when trying to sleep last night). E-Visit performed yesterday, 11/17/2018, with Shella Maxim NP. Was diagnosed with cough most likely due to virus. Prescribed Robitussin-DM. Patient with history of reactive airway disease. Typically in spring or fall season. Resolves with prednisone. Patient denies fever, chills, body aches, headache, ear pain, sinus pain, sore throat, chest pain, SOB.   Patient with frequent use of prednisone. Prescribed low-dose prednisone pack for rash via e-visit on 10/27/2018. Patient treated for bacterial cough via e-visit on 09/16/2018 with Z-Pak and Medrol Dose Pak. Patient treated for cough via e-visit on 08/17/2018 with prednisone 20mg : 1 tab bid x 5 days.  Patient regularly managed by [facility]; has appointment next week.  A complete, at least 10 system review of symptoms was performed, pertinent positives and negatives as mentioned in HPI, otherwise negative.  The following portions of the patient's history were reviewed and updated as appropriate: allergies, current medications and past medical history.  Patient Active Problem List   Diagnosis Date Noted  . Osteoarthritis of spine with radiculopathy, cervical region 10/20/2017    Allergies  Allergen Reactions  . Azathioprine Other (See Comments)    Flu-like symptoms, tachycardia  .  Bupropion Hives  . Lisinopril Cough  . Mycophenolate Mofetil Hives  . Sulfa Antibiotics Swelling    SWELLING REACTION UNSPECIFIED   . Pantoprazole Sodium Palpitations  . Tramadol Anxiety    Severe anxiety    Current Outpatient Medications on File Prior to Visit  Medication Sig Dispense Refill  . benzonatate (TESSALON) 200 MG capsule Take 1 capsule (200 mg total) by mouth 2 (two) times daily as needed for cough. 20 capsule 0  . clindamycin (CLINDAGEL) 1 % gel Apply topically 2 (two) times daily. To Rash 30 g 0  . hydrOXYzine (ATARAX/VISTARIL) 50 MG tablet   8  . ibuprofen (ADVIL,MOTRIN) 200 MG tablet Take 800 mg by mouth every 6 (six) hours as needed for headache or moderate pain.    Marland Kitchen losartan-hydrochlorothiazide (HYZAAR) 50-12.5 MG tablet Take 1 tablet by mouth daily.    . naproxen sodium (ANAPROX) 220 MG tablet Take 440 mg by mouth 2 (two) times daily as needed (for headache or pain).    Marland Kitchen omeprazole (PRILOSEC) 20 MG capsule Take 1 capsule (20 mg total) by mouth daily before breakfast. 60 capsule 3  . sertraline (ZOLOFT) 25 MG tablet Take 25 mg by mouth daily.    Marland Kitchen tiZANidine (ZANAFLEX) 4 MG tablet Take 1 tablet (4 mg total) by mouth every 6 (six) hours as needed for muscle spasms. 60 tablet 0  . Vitamin D, Ergocalciferol, (DRISDOL) 1.25 MG (50000 UT) CAPS capsule Take 1 capsul by mouth twice weekly     No current facility-administered medications on file prior to visit.        Objective:   Vitals:   11/18/18 1407  BP: (!) 148/102  Pulse: 77  Temp: 97.8 F (36.6 C)  SpO2: 97%     Wt Readings from Last 3 Encounters:  11/18/18 253 lb (114.8 kg)  02/02/18 240 lb (108.9 kg)  01/11/18 240 lb (108.9 kg)    Physical Exam:   General Appearance:  Alert, cooperative, appears stated age. In no acute distress. Afebrile. Sitting comfortably on examination table.  Head:  Normocephalic, without obvious abnormality, atraumatic  Eyes:  PERRL, conjunctiva/corneas clear, EOM's intact,  fundi benign, both eyes  Ears:  Normal TM's and external ear canals, both ears  Nose: Nares normal, septum midline. No discharge. Normal mucosa. No sinus tenderness with percussion/palpation.  Throat: Lips, mucosa, and tongue normal; teeth and gums normal. Throat reveals no erythema. Tonsils with no enlargement or exudate.  Neck: Supple, symmetrical, trachea midline, no adenopathy;    Lungs:   Diffuse expiratory wheezing; worse in bases bilaterally. Cough elicited with deep inspiration.  Heart:  Regular rate and rhythm, S1 and S2 normal, no murmur, rub, or gallop  Extremities: Extremities normal, atraumatic, no cyanosis or edema  Pulses: 2+ and symmetric  Skin: Skin color, texture, turgor normal, no rashes or lesions  Lymph nodes: Cervical, supraclavicular, and axillary nodes normal  Neurologic: Normal    Assessment & Plan:    Exam findings, diagnosis etiology and medication use and indications reviewed with patient. Follow-Up and discharge instructions provided. No emergent/urgent issues found on exam.  Patient education was provided.   Patient verbalized understanding of information provided and agrees with plan of care (POC), all questions answered. The patient is advised to call or return to clinic if condition does not see an improvement in symptoms, or to seek the care of the closest emergency department if condition worsens with the below plan.   Patient given albuterol nebulizer treatment. Post nebulizer pulse ox 98%. Patient with decrease in wheezing, improved aeration, and subjective symptom improvement.  1. Acute bronchitis with bronchospasm  - predniSONE (DELTASONE) 50 MG tablet; Take 1 tablet (50 mg total) by mouth daily with breakfast for 5 days.  Dispense: 5 tablet; Refill: 0 - albuterol (PROVENTIL HFA;VENTOLIN HFA) 108 (90 Base) MCG/ACT inhaler; Inhale 2 puffs into the lungs every 4 (four) hours as needed for wheezing or shortness of breath.  Dispense: 1 Inhaler; Refill:  0 - montelukast (SINGULAIR) 10 MG tablet; Take 1 tablet (10 mg total) by mouth at bedtime.  Dispense: 30 tablet; Refill: 0   Patient with one week history of cough. Patient with history of reactive airway disease. Wheezing on examination. Suspect patient has bronchitis with bronchospasm. Prescribed prednisone 50mg  5-day blast. Prescribed refill of albuterol inhaler and Singulair (patient has previously tolerated Singulair well). Patient to see PCP this upcoming Wednesday; advised discussion of if patient should be on daily steroid inhaler such as Advair, Symbicort, etc (given frequent courses of oral prednisone). Patient advised to go to the ED this weekend if she develops worsening chest tightness, wheezing, or shortness of breath.   Darlin Priestly, MHS, PA-C Montey Hora, MHS, PA-C Advanced Practice Provider [facility]  [address] [facility], [place], [place] [address] (p):  [phone] [email or web address]

## 2018-11-18 NOTE — Patient Instructions (Signed)
Thank you for choosing [facility] for your health care needs today.  You have been diagnosed with bronchitis (a chest cold) with associated bronchospasm (wheezing).  You have been prescribed prednisone, a steroid. Take 1 pill once a day x 5 days. Take with food to prevent stomach upset. Take at start of day (may cause difficulty sleeping).  Use albuterol inhaler, 2 puffs every 4-6 hours for cough/wheezing.  Take Singulair. 1 pill once a day. Given quantity #30 prescription. May discuss refill with family physician.  Increase fluids. Rest. May use Nyquil or Robitussin-DM for cough.  Follow-up at urgent care or ED if you develop chest pain, worsening chest tightness/shortness of breath, or other new/concerning symptom.  Follow-up with family physician in one week as previously scheduled.  Bronchospasm, Adult Bronchospasm is a tightening of the airways going into the lungs. During an episode, it may be harder to breathe. You may cough, and you may make a whistling sound when you breathe (wheeze). This condition often affects people with asthma. What are the causes? This condition is caused by swelling and irritation in the airways. It can be triggered by:  An infection (common).  Seasonal allergies.  An allergic reaction.  Exercise.  Irritants. These include pollution, cigarette smoke, strong odors, aerosol sprays, and paint fumes.  Weather changes. Winds increase molds and pollens in the air. Cold air may cause swelling.  Stress and emotional upset.  What are the signs or symptoms? Symptoms of this condition include:  Wheezing. If the episode was triggered by an allergy, wheezing may start right away or hours later.  Nighttime coughing.  Frequent or severe coughing with a simple cold.  Chest tightness.  Shortness of breath.  Decreased ability to exercise.  How is this diagnosed? This condition is usually diagnosed with a review of your medical history and a physical  exam. Tests, such as lung function tests, are sometimes done to look for other conditions. The need for a chest X-ray depends on where the wheezing occurs and whether it is the first time you have wheezed. How is this treated? This condition may be treated with:  Inhaled medicines. These open up the airways and help you breathe. They can be taken with an inhaler or a nebulizer device.  Corticosteroid medicines. These may be given for severe bronchospasm, usually when it is associated with asthma.  Avoiding triggers, such as irritants, infection, or allergies.  Follow these instructions at home: Medicines  Take over-the-counter and prescription medicines only as told by your health care provider.  If you need to use an inhaler or nebulizer to take your medicine, ask your health care provider to explain how to use it correctly. If you were given a spacer, always use it with your inhaler. Lifestyle  Reduce the number of triggers in your home. To do this: ? Change your heating and air conditioning filter at least once a month. ? Limit your use of fireplaces and wood stoves. ? Do not smoke. Do not allow smoking in your home. ? Avoid using perfumes and fragrances. ? Get rid of pests, such as roaches and mice, and their droppings. ? Remove any mold from your home. ? Keep your house clean and dust free. Use unscented cleaning products. ? Replace carpet with wood, tile, or vinyl flooring. Carpet can trap dander and dust. ? Use allergy-proof pillows, mattress covers, and box spring covers. ? Wash bed sheets and blankets every week in hot water. Dry them in a dryer. ? Use blankets  that are made of polyester or cotton. ? Wash your hands often. ? Do not allow pets in your bedroom.  Avoid breathing in cold air when you exercise. General instructions  Have a plan for seeking medical care. Know when to call your health care provider and local emergency services, and where to get emergency  care.  Stay up to date on your immunizations.  When you have an episode of bronchospasm, stay calm. Try to relax and breathe more slowly.  If you have asthma, make sure you have an asthma action plan.  Keep all follow-up visits as told by your health care provider. This is important. Contact a health care provider if:  You have muscle aches.  You have chest pain.  The mucus that you cough up (sputum) changes from clear or white to yellow, green, gray, or bloody.  You have a fever.  Your sputum gets thicker. Get help right away if:  Your wheezing and coughing get worse, even after you take your prescribed medicines.  It gets even harder to breathe.  You develop severe chest pain. Summary  Bronchospasm is a tightening of the airways going into the lungs.  During an episode of bronchospasm, you may have a harder time breathing. You may cough and make a whistling sound when you breathe (wheeze).  Avoid exposure to triggers such as smoke, dust, mold, animal dander, and fragrances.  When you have an episode of bronchospasm, stay calm. Try to relax and breathe more slowly. This information is not intended to replace advice given to you by your health care provider. Make sure you discuss any questions you have with your health care provider. Document Released: 12/17/2003 Document Revised: 12/10/2016 Document Reviewed: 12/10/2016 Elsevier Interactive Patient Education  2017 [employer].

## 2018-11-21 ENCOUNTER — Telehealth: Payer: Self-pay | Admitting: Emergency Medicine

## 2018-11-21 NOTE — Telephone Encounter (Signed)
Left message follow up call from visit with [facility]. 

## 2018-11-23 DIAGNOSIS — E669 Obesity, unspecified: Secondary | ICD-10-CM | POA: Diagnosis not present

## 2018-12-07 ENCOUNTER — Ambulatory Visit (INDEPENDENT_AMBULATORY_CARE_PROVIDER_SITE_OTHER): Payer: Self-pay | Admitting: Physician Assistant

## 2018-12-07 VITALS — BP 136/90 | HR 102 | Temp 98.3°F | Resp 17 | Wt 250.0 lb

## 2018-12-07 DIAGNOSIS — J209 Acute bronchitis, unspecified: Secondary | ICD-10-CM

## 2018-12-07 MED ORDER — PREDNISONE 10 MG (21) PO TBPK: ORAL_TABLET | ORAL | 0 refills | Status: DC

## 2018-12-07 MED ORDER — ALBUTEROL SULFATE (2.5 MG/3ML) 0.083% IN NEBU
2.5000 mg | INHALATION_SOLUTION | Freq: Once | RESPIRATORY_TRACT | Status: AC
  Administered 2018-12-07: 2.5 mg via RESPIRATORY_TRACT

## 2018-12-07 MED ORDER — BECLOMETHASONE DIPROP HFA 80 MCG/ACT IN AERB: 1.0000 | INHALATION_SPRAY | Freq: Two times a day (BID) | RESPIRATORY_TRACT | 0 refills | Status: DC

## 2018-12-07 NOTE — Progress Notes (Signed)
Patient ID: Ashley Paul DOB: [birth year] AGE: 47 y.o. MRN: [id number]   PCP: Sharyne Peach, MD   Chief Complaint:  Chief Complaint  Patient presents with  . wheezing,cough,shortness of breathx7     Subjective:    HPI:  Ashley Paul is a 47 y.o. female presents for evaluation  Chief Complaint  Patient presents with  . wheezing,cough,shortness of breathx4   47 year old female presents to [facility] with one week history of cough. Began as dry cough. Then developed chest tightness. Has since developed chest congestion/heaviness, wheezing, and shortness of breath with exertion. Wheezing most severe when trying to sleep at night. Cough coarse and rarely productive. Has taken OTC Mucinex and used previously prescribed Singulair with no improvement. Patient used albuterol inhaler, feels it does not help. Reports yesterday developed malaise, headache, bodyaches, and nausea. Denies fever, chills, ear pain, sinus pain, nasal congestion, sore throat, chest pain, abdominal pain, vomiting.  Patient seen at [facility] on 11/18/2018, 2-1/2 weeks ago, with one week history of cough. Associated wheezing. Patient given albuterol nebulizer treatment. Prescribed prednisone blast, 50mg  qd x 5 days, albuterol inhaler, and Singulair. Patient states symptoms completely resolved; until one week ago. Denies unknown trigger causing symptoms.  Patient at last office visit was advised to discuss frequent episodes of bronchospasm with use of oral steroids with PCP. Patient states she saw PCP, but forgot to mention it. Patient has another appointment with PCP in just over two weeks.  Patient has cataract surgery scheduled for December 26, 2018.  Review of symptoms was performed, pertinent positives and negatives as mentioned in HPI.  The following portions of the patient's history were reviewed and updated as appropriate: allergies, current medications and past medical history.  Patient  Active Problem List   Diagnosis Date Noted  . Osteoarthritis of spine with radiculopathy, cervical region 10/20/2017    Allergies  Allergen Reactions  . Azathioprine Other (See Comments)    Flu-like symptoms, tachycardia  . Bupropion Hives  . Lisinopril Cough  . Mycophenolate Mofetil Hives  . Sulfa Antibiotics Swelling    SWELLING REACTION UNSPECIFIED   . Pantoprazole Sodium Palpitations  . Tramadol Anxiety    Severe anxiety    Current Outpatient Medications on File Prior to Visit  Medication Sig Dispense Refill  . albuterol (PROVENTIL HFA;VENTOLIN HFA) 108 (90 Base) MCG/ACT inhaler Inhale 2 puffs into the lungs every 4 (four) hours as needed for wheezing or shortness of breath. 1 Inhaler 0  . benzonatate (TESSALON) 200 MG capsule Take 1 capsule (200 mg total) by mouth 2 (two) times daily as needed for cough. 20 capsule 0  . hydrOXYzine (ATARAX/VISTARIL) 50 MG tablet   8  . ibuprofen (ADVIL,MOTRIN) 200 MG tablet Take 800 mg by mouth every 6 (six) hours as needed for headache or moderate pain.    Marland Kitchen losartan-hydrochlorothiazide (HYZAAR) 50-12.5 MG tablet Take 1 tablet by mouth daily.    . montelukast (SINGULAIR) 10 MG tablet Take 1 tablet (10 mg total) by mouth at bedtime. 30 tablet 0  . naproxen sodium (ANAPROX) 220 MG tablet Take 440 mg by mouth 2 (two) times daily as needed (for headache or pain).    Marland Kitchen omeprazole (PRILOSEC) 20 MG capsule Take 1 capsule (20 mg total) by mouth daily before breakfast. 60 capsule 3  . sertraline (ZOLOFT) 25 MG tablet Take 25 mg by mouth daily.    . Vitamin D, Ergocalciferol, (DRISDOL) 1.25 MG (50000 UT) CAPS capsule Take 1 capsul by  mouth twice weekly    . clindamycin (CLINDAGEL) 1 % gel Apply topically 2 (two) times daily. To Rash (Patient not taking: Reported on 12/07/2018) 30 g 0  . tiZANidine (ZANAFLEX) 4 MG tablet Take 1 tablet (4 mg total) by mouth every 6 (six) hours as needed for muscle spasms. (Patient not taking: Reported on 12/07/2018) 60  tablet 0   No current facility-administered medications on file prior to visit.        Objective:   Vitals:   12/07/18 1527  BP: 136/90  Pulse: (!) 102  Resp: 17  Temp: 98.3 F (36.8 C)  SpO2: 98%     Wt Readings from Last 3 Encounters:  12/07/18 250 lb (113.4 kg)  11/18/18 253 lb (114.8 kg)  02/02/18 240 lb (108.9 kg)    Physical Exam:   General Appearance:  Sitting comfortably on examination table, cooperative, appears stated age. In no acute distress. Afebrile.  Head:  Normocephalic, without obvious abnormality, atraumatic  Eyes:  PERRL, conjunctiva/corneas clear, EOM's intact  Ears:  Normal TM's and external ear canals, both ears  Nose: Nares normal, septum midline. Nasal mucosa reveals no edema. No discharge. No sinus tenderness with percussion/palpation.  Throat: Lips, mucosa, and tongue normal; teeth and gums normal. Throat reveals no erythema. Tonsils with no enlargement or exudate.  Neck: Supple, symmetrical, trachea midline, no adenopathy  Lungs:   Lung auscultation reveals diffuse wheezing in all long fields. Worse at end expiration. Coarse cough with deep inspiration.  Heart:  Regular rate (minimally tachycardic) and rhythm, S1 and S2 normal, no murmur, rub, or gallop  Abdomen:   Soft, non-tender, bowel sounds active all four quadrants,  no masses, no organomegaly  Extremities: Extremities normal, atraumatic, no cyanosis or edema  Pulses: 2+ and symmetric  Skin: Skin color, texture, turgor normal, no rashes or lesions  Lymph nodes: Cervical, supraclavicular, and axillary nodes normal  Neurologic: Normal    Assessment & Plan:    Exam findings, diagnosis etiology and medication use and indications reviewed with patient. Follow-Up and discharge instructions provided. No emergent/urgent issues found on exam.  Patient education was provided.   Patient verbalized understanding of information provided and agrees with plan of care (POC), all questions answered.  The patient is advised to call or return to clinic if condition does not see an improvement in symptoms, or to seek the care of the closest emergency department if condition worsens with the below plan.    1. Bronchitis with bronchospasm  - predniSONE (STERAPRED UNI-PAK 21 TAB) 10 MG (21) TBPK tablet; Take 6 tabs po qd x 1 day, then 5 tabs, then 4 tabs, then 3 tabs, then 2 tabs, then 1 tab  Dispense: 21 tablet; Refill: 0 - beclomethasone (QVAR REDIHALER) 80 MCG/ACT inhaler; Inhale 1 puff into the lungs 2 (two) times daily.  Dispense: 1 Inhaler; Refill: 0 - albuterol (PROVENTIL) (2.5 MG/3ML) 0.083% nebulizer solution 2.5 mg  Albuterol nebulizer treatment provided in office. Pre neb 98% pulse ox. Post neb 98% pulse ox. Minimal subjective symptom improvement. Improved aeration. Continued wheezing; harsh wheezing in lower lobes at end expiration.   Patient with one week history of cough with wheezing, chest tightness, and SOB. VSS. Pulse ox 98%. Patient in no acute distress. Patient given albuterol nebulizer treatment in office. Prescribed Sterapred pak. Patient advised to continue to use albuterol inhaler and Singulair. Prescribed low-dose inhaled corticosteroid, QVAR, in attempt to prevent recurrent bronchospasm. Patient has appointment with PCP in two weeks, will discuss asthma/allergy  evaluation and further management/treatment. Patient advised to return to clinic in two days if not better, to go directly to the ED with worsening symptoms.   Darlin Priestly, MHS, PA-C Montey Hora, MHS, PA-C Advanced Practice Provider [facility]  [address], [facility], [place], [place] [address] (p):  [phone] [email or web address]

## 2018-12-07 NOTE — Patient Instructions (Signed)
Thank you for choosing [facility] for your health care needs.  You have been diagnosed with bronchitis with bronchospasm.  Recommend increase fluids; water, Gatorade, or hot tea with lemon/honey. Rest.  May use humidifier or vaporizer in bedroom. Prop self up with several pillows, will help with cough at night and sleeping.  May use over the counter Delsym or Robitussin-DM for cough.  You have been prescribed course of prednisone; 6 tabs (take all together like one dose) x day, then 5 tabs next day, then 4 tabs next day, then 3 tabs next day, then 2 tabs next day, then 1 tab on the final day.  You have also been started on a steroid inhaler, QVAR. Use 1 puff twice a day.  Continue to use albuterol inhaler. 2 puffs every 4 hours when awake. Use regularly for next 2-3 days.  Recommend you continue to use Singulair. Take one tablet by mouth once a day.  Follow-up at [facility], urgent care, or with family physician in 2 days if symptoms not improving. Go directly to the ED if symptoms worsen.  Discuss frequent episodes of bronchitis with bronchospasm, requiring course of oral steroids, with family physician at upcoming appointment. May need to be evaluated by pulmonologist.  Bronchospasm, Adult Bronchospasm is when airways in the lungs get smaller. When this happens, it can be hard to breathe. You may cough. You may also make a whistling sound when you breathe (wheeze). Follow these instructions at home: Medicines  Take over-the-counter and prescription medicines only as told by your doctor.  If you need to use an inhaler or nebulizer to take your medicine, ask your doctor how to use it.  If you were given a spacer, always use it with your inhaler. Lifestyle  Change your heating and air conditioning filter. Do this at least once a month.  Try not to use fireplaces and wood stoves.  Do not  smoke. Do not  allow smoking in your home.  Try not to use things that have a strong smell,  like perfume.  Get rid of pests (such as roaches and mice) and their poop.  Remove any mold from your home.  Keep your house clean. Get rid of dust.  Use cleaning products that have no smell.  Replace carpet with wood, tile, or vinyl flooring.  Use allergy-proof pillows, mattress covers, and box spring covers.  Wash bed sheets and blankets every week. Use hot water. Dry them in a dryer.  Use blankets that are made of polyester or cotton.  Wash your hands often.  Keep pets out of your bedroom.  When you exercise, try not to breathe in cold air. General instructions  Have a plan for getting medical care. Know these things: ? When to call your doctor. ? When to call local emergency services (911 in the [place] ? Where to go in an emergency.  Stay up to date on your shots (immunizations).  When you have an episode: ? Stay calm. ? Relax. ? Breathe slowly. Contact a doctor if:  Your muscles ache.  Your chest hurts.  The color of the mucus you cough up (sputum) changes from clear or white to yellow, green, gray, or bloody.  The mucus you cough up gets thicker.  You have a fever. Get help right away if:  The whistling sound gets worse, even after you take your medicines.  Your coughing gets worse.  You find it even harder to breathe.  Your chest hurts very much. Summary  Bronchospasm  is when airways in the lungs get smaller.  When this happens, it can be hard to breathe. You may cough. You may also make a whistling sound when you breathe.  Stay away from things that cause you to have episodes. These include smoke or dust. This information is not intended to replace advice given to you by your health care provider. Make sure you discuss any questions you have with your health care provider. Document Released: 10/11/2009 Document Revised: 12/17/2016 Document Reviewed: 12/17/2016 Elsevier Interactive Patient Education  2017 [employer].

## 2018-12-09 ENCOUNTER — Telehealth: Payer: Self-pay | Admitting: Emergency Medicine

## 2018-12-09 NOTE — Telephone Encounter (Signed)
Left message follow up call from visit with [facility]. 

## 2018-12-12 DIAGNOSIS — H2512 Age-related nuclear cataract, left eye: Secondary | ICD-10-CM | POA: Diagnosis not present

## 2018-12-15 ENCOUNTER — Encounter: Payer: Self-pay | Admitting: *Deleted

## 2018-12-15 ENCOUNTER — Other Ambulatory Visit: Payer: Self-pay

## 2018-12-19 NOTE — Discharge Instructions (Signed)

## 2018-12-26 ENCOUNTER — Ambulatory Visit
Admission: RE | Admit: 2018-12-26 | Discharge: 2018-12-26 | Disposition: A | Discharge status: Home / Self Care | Payer: 59 | Attending: Ophthalmology | Admitting: Ophthalmology

## 2018-12-26 ENCOUNTER — Encounter
Admission: RE | Disposition: A | Discharge status: Home / Self Care | Payer: Self-pay | Source: Home / Self Care | Attending: Ophthalmology

## 2018-12-26 ENCOUNTER — Ambulatory Visit: Payer: 59 | Admitting: Anesthesiology

## 2018-12-26 DIAGNOSIS — Z79899 Other long term (current) drug therapy: Secondary | ICD-10-CM | POA: Diagnosis not present

## 2018-12-26 DIAGNOSIS — M1990 Unspecified osteoarthritis, unspecified site: Secondary | ICD-10-CM | POA: Diagnosis not present

## 2018-12-26 DIAGNOSIS — Z87891 Personal history of nicotine dependence: Secondary | ICD-10-CM | POA: Diagnosis not present

## 2018-12-26 DIAGNOSIS — I10 Essential (primary) hypertension: Secondary | ICD-10-CM | POA: Diagnosis not present

## 2018-12-26 DIAGNOSIS — H2512 Age-related nuclear cataract, left eye: Secondary | ICD-10-CM | POA: Insufficient documentation

## 2018-12-26 DIAGNOSIS — F419 Anxiety disorder, unspecified: Secondary | ICD-10-CM | POA: Diagnosis not present

## 2018-12-26 DIAGNOSIS — H25812 Combined forms of age-related cataract, left eye: Secondary | ICD-10-CM | POA: Diagnosis not present

## 2018-12-26 HISTORY — PX: CATARACT EXTRACTION W/PHACO: SHX586

## 2018-12-26 SURGERY — PHACOEMULSIFICATION, CATARACT, WITH IOL INSERTION
Anesthesia: Monitor Anesthesia Care | Site: Eye | Laterality: Left

## 2018-12-26 MED ORDER — SODIUM HYALURONATE 23 MG/ML IO SOLN
INTRAOCULAR | Status: DC | PRN
  Administered 2018-12-26: 0.6 mL via INTRAOCULAR

## 2018-12-26 MED ORDER — MOXIFLOXACIN HCL 0.5 % OP SOLN
OPHTHALMIC | Status: DC | PRN
  Administered 2018-12-26: 0.2 mL via OPHTHALMIC

## 2018-12-26 MED ORDER — EPINEPHRINE PF 1 MG/ML IJ SOLN
INTRAOCULAR | Status: DC | PRN
  Administered 2018-12-26: 48 mL via OPHTHALMIC

## 2018-12-26 MED ORDER — TETRACAINE HCL 0.5 % OP SOLN
1.0000 [drp] | OPHTHALMIC | Status: DC | PRN
  Administered 2018-12-26 (×2): 1 [drp] via OPHTHALMIC

## 2018-12-26 MED ORDER — ARMC OPHTHALMIC DILATING DROPS
1.0000 "application " | OPHTHALMIC | Status: DC | PRN
  Administered 2018-12-26 (×3): 1 via OPHTHALMIC

## 2018-12-26 MED ORDER — FENTANYL CITRATE (PF) 100 MCG/2ML IJ SOLN
INTRAMUSCULAR | Status: DC | PRN
  Administered 2018-12-26 (×2): 50 ug via INTRAVENOUS

## 2018-12-26 MED ORDER — SODIUM HYALURONATE 10 MG/ML IO SOLN
INTRAOCULAR | Status: DC | PRN
  Administered 2018-12-26: 0.55 mL via INTRAOCULAR

## 2018-12-26 MED ORDER — LIDOCAINE HCL (PF) 2 % IJ SOLN
INTRAOCULAR | Status: DC | PRN
  Administered 2018-12-26: 1 mL via INTRAOCULAR

## 2018-12-26 MED ORDER — LACTATED RINGERS IV SOLN: INTRAVENOUS | Status: DC

## 2018-12-26 MED ORDER — MIDAZOLAM HCL 2 MG/2ML IJ SOLN
INTRAMUSCULAR | Status: DC | PRN
  Administered 2018-12-26: 2 mg via INTRAVENOUS
  Administered 2018-12-26: 1 mg via INTRAVENOUS

## 2018-12-26 SURGICAL SUPPLY — 19 items
CANNULA ANT/CHMB 27G (MISCELLANEOUS) ×2 IMPLANT
CANNULA ANT/CHMB 27GA (MISCELLANEOUS) ×6 IMPLANT
DISSECTOR HYDRO NUCLEUS 50X22 (MISCELLANEOUS) ×3 IMPLANT
GLOVE SURG LX 7.5 STRW (GLOVE) ×2
GLOVE SURG LX STRL 7.5 STRW (GLOVE) ×1 IMPLANT
GLOVE SURG SYN 8.5  E (GLOVE) ×2
GLOVE SURG SYN 8.5 E (GLOVE) ×1 IMPLANT
GLOVE SURG SYN 8.5 PF PI (GLOVE) ×1 IMPLANT
GOWN STRL REUS W/ TWL LRG LVL3 (GOWN DISPOSABLE) ×2 IMPLANT
GOWN STRL REUS W/TWL LRG LVL3 (GOWN DISPOSABLE) ×4
LENS IOL TECNIS ITEC 11.5 (Intraocular Lens) ×2 IMPLANT
MARKER SKIN DUAL TIP RULER LAB (MISCELLANEOUS) ×3 IMPLANT
PACK DR. KING ARMS (PACKS) ×3 IMPLANT
PACK EYE AFTER SURG (MISCELLANEOUS) ×3 IMPLANT
PACK OPTHALMIC (MISCELLANEOUS) ×3 IMPLANT
SYR 3ML LL SCALE MARK (SYRINGE) ×3 IMPLANT
SYR TB 1ML LUER SLIP (SYRINGE) ×3 IMPLANT
WATER STERILE IRR 500ML POUR (IV SOLUTION) ×3 IMPLANT
WIPE NON LINTING 3.25X3.25 (MISCELLANEOUS) ×3 IMPLANT

## 2018-12-26 NOTE — Anesthesia Preprocedure Evaluation (Signed)
Anesthesia Evaluation  Patient identified by MRN, date of birth, ID band  Reviewed: NPO status   History of Anesthesia Complications Negative for: history of anesthetic complications  Airway Mallampati: II  TM Distance: >3 FB Neck ROM: full    Dental no notable dental hx.    Pulmonary asthma (mild) , former smoker,    Pulmonary exam normal        Cardiovascular Exercise Tolerance: Good hypertension, Normal cardiovascular exam     Neuro/Psych Anxiety Depression negative neurological ROS     GI/Hepatic Neg liver ROS, GERD  Controlled and Medicated,  Endo/Other  Morbid obesity (bmi 38)goiter  Renal/GU negative Renal ROS  negative genitourinary   Musculoskeletal  (+) Arthritis , acdf    Abdominal   Peds  Hematology negative hematology ROS (+)   Anesthesia Other Findings   Reproductive/Obstetrics                             Anesthesia Physical Anesthesia Plan  ASA: II  Anesthesia Plan: MAC   Post-op Pain Management:    Induction:   PONV Risk Score and Plan:   Airway Management Planned:   Additional Equipment:   Intra-op Plan:   Post-operative Plan:   Informed Consent: I have reviewed the patients History and Physical, chart, labs and discussed the procedure including the risks, benefits and alternatives for the proposed anesthesia with the patient or authorized representative who has indicated his/her understanding and acceptance.     Plan Discussed with: CRNA  Anesthesia Plan Comments:         Anesthesia Quick Evaluation

## 2018-12-26 NOTE — Op Note (Signed)
OPERATIVE NOTE  ENJOLI Ashley Paul [id number] 12/26/2018   PREOPERATIVE DIAGNOSIS:  Nuclear sclerotic cataract left eye.  H25.12   POSTOPERATIVE DIAGNOSIS:    Nuclear sclerotic cataract left eye.     PROCEDURE:  Phacoemusification with posterior chamber intraocular lens placement of the left eye   LENS:   Implant Name Type Inv. Item Serial No. Manufacturer Lot No. LRB No. Used  LENS IOL DIOP 11.5 - [id number] Intraocular Lens LENS IOL DIOP 11.5 [id number] AMO  Left 1       PCB00 +11.5   ULTRASOUND TIME: 0 minutes 12 seconds.  CDE 0.79   SURGEON:  Benay Pillow, MD, MPH   ANESTHESIA:  Topical with tetracaine drops augmented with 1% preservative-free intracameral lidocaine.  ESTIMATED BLOOD LOSS: <1 mL   COMPLICATIONS:  None.   DESCRIPTION OF PROCEDURE:  The patient was identified in the holding room and transported to the operating room and placed in the supine position under the operating microscope.  The left eye was identified as the operative eye and it was prepped and draped in the usual sterile ophthalmic fashion.   A 1.0 millimeter clear-corneal paracentesis was made at the 5:00 position. 0.5 ml of preservative-free 1% lidocaine with epinephrine was injected into the anterior chamber.  The anterior chamber was filled with Healon 5 viscoelastic.  A 2.4 millimeter keratome was used to make a near-clear corneal incision at the 2:00 position.  A curvilinear capsulorrhexis was made with a cystotome and capsulorrhexis forceps.  Balanced salt solution was used to hydrodissect and hydrodelineate the nucleus.   Phacoemulsification was then used in stop and chop fashion to remove the lens nucleus and epinucleus.  The remaining cortex was then removed using the irrigation and aspiration handpiece. Healon was then placed into the capsular bag to distend it for lens placement.  A lens was then injected into the capsular bag.  The remaining viscoelastic was aspirated.   Wounds were hydrated  with balanced salt solution.  The anterior chamber was inflated to a physiologic pressure with balanced salt solution.  Intracameral vigamox 0.1 mL undiltued was injected into the eye and a drop placed onto the ocular surface.  No wound leaks were noted.  The patient was taken to the recovery room in stable condition without complications of anesthesia or surgery  Benay Pillow 12/26/2018, 11:34 AM

## 2018-12-26 NOTE — Anesthesia Postprocedure Evaluation (Signed)
Anesthesia Post Note  Patient: Ashley Paul  Procedure(s) Performed: CATARACT EXTRACTION PHACO AND INTRAOCULAR LENS PLACEMENT ([place])  LEFT (Left Eye)  Patient location during evaluation: PACU Anesthesia Type: MAC Level of consciousness: awake and alert Pain management: pain level controlled Vital Signs Assessment: post-procedure vital signs reviewed and stable Respiratory status: spontaneous breathing, nonlabored ventilation, respiratory function stable and patient connected to nasal cannula oxygen Cardiovascular status: stable and blood pressure returned to baseline Postop Assessment: no apparent nausea or vomiting Anesthetic complications: no    Janiyla Long

## 2018-12-26 NOTE — H&P (Signed)

## 2018-12-26 NOTE — Anesthesia Procedure Notes (Signed)
Procedure Name: MAC Date/Time: 12/26/2018 11:10 AM Performed by: Cameron Ali, CRNA Pre-anesthesia Checklist: Patient identified, Emergency Drugs available, Suction available, Timeout performed and Patient being monitored Patient Re-evaluated:Patient Re-evaluated prior to induction Oxygen Delivery Method: Nasal cannula Placement Confirmation: positive ETCO2

## 2018-12-26 NOTE — Transfer of Care (Signed)
Immediate Anesthesia Transfer of Care Note  Patient: Ashley Paul  Procedure(s) Performed: CATARACT EXTRACTION PHACO AND INTRAOCULAR LENS PLACEMENT ([facility])  LEFT (Left Eye)  Patient Location: PACU  Anesthesia Type: MAC  Level of Consciousness: awake, alert  and patient cooperative  Airway and Oxygen Therapy: Patient Spontanous Breathing and Patient connected to supplemental oxygen  Post-op Assessment: Post-op Vital signs reviewed, Patient's Cardiovascular Status Stable, Respiratory Function Stable, Patent Airway and No signs of Nausea or vomiting  Post-op Vital Signs: Reviewed and stable  Complications: No apparent anesthesia complications

## 2018-12-27 ENCOUNTER — Encounter: Payer: Self-pay | Admitting: Ophthalmology

## 2019-01-12 DIAGNOSIS — J4540 Moderate persistent asthma, uncomplicated: Secondary | ICD-10-CM | POA: Insufficient documentation

## 2019-01-16 ENCOUNTER — Encounter: Payer: Self-pay | Admitting: Dietician

## 2019-01-16 ENCOUNTER — Encounter: Payer: 59 | Attending: Surgery | Admitting: Dietician

## 2019-01-16 VITALS — Ht 67.5 in | Wt 253.7 lb

## 2019-01-16 DIAGNOSIS — E669 Obesity, unspecified: Secondary | ICD-10-CM | POA: Insufficient documentation

## 2019-01-16 NOTE — Patient Instructions (Signed)
   Begin working through the [employer] discussed today.   Take multivitamin and vitamin D supplements daily until surgery if you are not already.   Look for protein shakes that work for you and meet the criteria (> 15 grams of protein and < 5 grams of carbohydrates.)  Let us know if you have any questions or concerns, see you at Pre-Op Class!

## 2019-01-16 NOTE — Progress Notes (Signed)
Bariatric Pre-Op Nutrition Assessment for Planned RYGB Surgery Medical Nutrition Therapy  Appt Start Time: 4:15pm  End time: 4:45pm  Patient was seen on 01/16/2019 for Pre-Operative Nutrition Assessment. Assessment and letter of approval faxed to [facility] Surgery Bariatric Surgery Program coordinator on 01/16/2019.   Pt expectation of surgery: To help lose weight out of fear that she will have poor health and develop health conditions that run in her family. Pt states she would like to lose about 70 lbs.  Pt expectation of dietitian: None stated.   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 Pt lives with her husband and 3 of her 4 children. Pt is kind, personable, and motivated for surgery. Her dad and brother have both had bariatric surgery.   24-Hr Dietary Recall First Meal: banana  Snack: none Second Meal: rice + BBQ chicken  Snack: none Third Meal: chicken + potato + vegetable  Snack: brownies Beverages: water + coffee w/ cream + [place] diet green tea  Food & Nutrition Related Hx Dietary Hx: Pt states she does not drink sodas. Pt states she has a cup of coffee every morning. Pt states it is difficult to cook for her husband, children, and self while trying to accommodate everyone. Pt states she likes sweets. Pt states she usually eats well until she gets home in the evenings, and usually has dessert after dinner.  Supplements: vitamin D + biotin Estimated Daily Fluid Intake: 64-96 oz GI / Other Notable Symptoms: heartburn/reflux  Physical Activity  Current average weekly physical activity: none currently (plans to utilize gym  membership soon)  Estimated Energy Needs Calories: 1600 Carbohydrate: 180g Protein: 120g Fat: 44g  Pre-Op Goals Reviewed with the Patient . Track food and beverage intake (try MyFitness Pal or the Baritastic app) . Make healthy food choices while monitoring portion sizes . Avoid concentrated sugars and fried foods . Keep fat & sugar in the single digits per serving on food labels . Practice CHEWING your food (aim for applesauce consistency) . Practice not drinking 15 minutes before, during, and 30 minutes after each meal and snack . Avoid all carbonated beverages (ex: soda, sparkling beverages)  . Limit caffeinated beverages (ex: coffee, tea, energy drinks) . Avoid all sugar-sweetened beverages (ex: regular soda, sports drinks)  . Avoid alcohol  . Consume 3 meals per day or try to eat every 3-5 hours . Make a list of non-food related activities . Aim for 64-100 ounces of FLUID daily (with at least half of fluid intake being plain water)  . Aim for at least 60-80 grams of PROTEIN daily . Look for a liquid protein source that contains ?15 g protein and ?5 g carbohydrate (ex: shakes, drinks, shots) . Physical activity is an important part of a healthy lifestyle so keep it moving! The goal is to reach 150 minutes of exercise per week, including cardiovascular and weight baring activity.  Handouts Provided Include  . Bariatric Surgery handouts (Nutrition Visits, Pre-Op Goals, Protein Shakes, Vitamins & Minerals, Support Group 2020 Schedule)  Learning Style & Readiness for Change Teaching method utilized: Visual & Auditory  Demonstrated degree of understanding via: Teach Back  Barriers to learning/adherence to lifestyle change: None Identified  Next Steps Supervised  Weight Loss (SWL) Visits Needed: 0  Patient is to return to [facility] for Pre-Op Class (>2 weeks before surgery) and Post-Op Class (2 weeks after surgery) for further nutrition education when surgery date is scheduled.

## 2019-01-19 ENCOUNTER — Ambulatory Visit: Payer: 59 | Admitting: Psychology

## 2019-01-19 DIAGNOSIS — F509 Eating disorder, unspecified: Secondary | ICD-10-CM | POA: Diagnosis not present

## 2019-01-26 ENCOUNTER — Ambulatory Visit (INDEPENDENT_AMBULATORY_CARE_PROVIDER_SITE_OTHER): Payer: 59 | Admitting: Psychology

## 2019-01-26 DIAGNOSIS — F509 Eating disorder, unspecified: Secondary | ICD-10-CM

## 2019-01-27 DIAGNOSIS — H2511 Age-related nuclear cataract, right eye: Secondary | ICD-10-CM | POA: Diagnosis not present

## 2019-01-30 ENCOUNTER — Encounter: Payer: Self-pay | Admitting: *Deleted

## 2019-01-30 ENCOUNTER — Other Ambulatory Visit: Payer: Self-pay

## 2019-02-01 NOTE — Discharge Instructions (Signed)

## 2019-02-03 NOTE — Anesthesia Preprocedure Evaluation (Addendum)
Anesthesia Evaluation  Patient identified by MRN, date of birth, ID band Patient awake    Reviewed: Allergy & Precautions, NPO status , Patient's Chart, lab work & pertinent test results  History of Anesthesia Complications Negative for: history of anesthetic complications  Airway Mallampati: I   Neck ROM: Full    Dental no notable dental hx.    Pulmonary asthma , former smoker (quit 2011),    Pulmonary exam normal breath sounds clear to auscultation       Cardiovascular hypertension, Normal cardiovascular exam Rhythm:Regular Rate:Normal     Neuro/Psych PSYCHIATRIC DISORDERS Anxiety Depression  Neuromuscular disease (cervical radiculopathy s/p ACDF)    GI/Hepatic GERD  ,  Endo/Other  Obesity; goiter  Renal/GU negative Renal ROS     Musculoskeletal  (+) Arthritis ,   Abdominal   Peds  Hematology negative hematology ROS (+)   Anesthesia Other Findings   Reproductive/Obstetrics                            Anesthesia Physical Anesthesia Plan  ASA: II  Anesthesia Plan: MAC   Post-op Pain Management:    Induction: Intravenous  PONV Risk Score and Plan: 2  Airway Management Planned: Natural Airway  Additional Equipment:   Intra-op Plan:   Post-operative Plan:   Informed Consent: I have reviewed the patients History and Physical, chart, labs and discussed the procedure including the risks, benefits and alternatives for the proposed anesthesia with the patient or authorized representative who has indicated his/her understanding and acceptance.       Plan Discussed with: CRNA  Anesthesia Plan Comments:        Anesthesia Quick Evaluation

## 2019-02-06 ENCOUNTER — Encounter
Admission: RE | Disposition: A | Discharge status: Home / Self Care | Payer: Self-pay | Source: Home / Self Care | Attending: Ophthalmology

## 2019-02-06 ENCOUNTER — Ambulatory Visit: Payer: 59 | Admitting: Anesthesiology

## 2019-02-06 ENCOUNTER — Ambulatory Visit
Admission: RE | Admit: 2019-02-06 | Discharge: 2019-02-06 | Disposition: A | Discharge status: Home / Self Care | Payer: 59 | Attending: Ophthalmology | Admitting: Ophthalmology

## 2019-02-06 DIAGNOSIS — Z888 Allergy status to other drugs, medicaments and biological substances status: Secondary | ICD-10-CM | POA: Insufficient documentation

## 2019-02-06 DIAGNOSIS — I10 Essential (primary) hypertension: Secondary | ICD-10-CM | POA: Diagnosis not present

## 2019-02-06 DIAGNOSIS — Z7951 Long term (current) use of inhaled steroids: Secondary | ICD-10-CM | POA: Diagnosis not present

## 2019-02-06 DIAGNOSIS — H2511 Age-related nuclear cataract, right eye: Secondary | ICD-10-CM | POA: Diagnosis not present

## 2019-02-06 DIAGNOSIS — J45909 Unspecified asthma, uncomplicated: Secondary | ICD-10-CM | POA: Diagnosis not present

## 2019-02-06 DIAGNOSIS — Z885 Allergy status to narcotic agent status: Secondary | ICD-10-CM | POA: Insufficient documentation

## 2019-02-06 DIAGNOSIS — E669 Obesity, unspecified: Secondary | ICD-10-CM | POA: Diagnosis not present

## 2019-02-06 DIAGNOSIS — Z87891 Personal history of nicotine dependence: Secondary | ICD-10-CM | POA: Insufficient documentation

## 2019-02-06 DIAGNOSIS — Z79899 Other long term (current) drug therapy: Secondary | ICD-10-CM | POA: Diagnosis not present

## 2019-02-06 DIAGNOSIS — F419 Anxiety disorder, unspecified: Secondary | ICD-10-CM | POA: Insufficient documentation

## 2019-02-06 DIAGNOSIS — F329 Major depressive disorder, single episode, unspecified: Secondary | ICD-10-CM | POA: Insufficient documentation

## 2019-02-06 DIAGNOSIS — K219 Gastro-esophageal reflux disease without esophagitis: Secondary | ICD-10-CM | POA: Insufficient documentation

## 2019-02-06 DIAGNOSIS — Z6839 Body mass index (BMI) 39.0-39.9, adult: Secondary | ICD-10-CM | POA: Diagnosis not present

## 2019-02-06 DIAGNOSIS — Z882 Allergy status to sulfonamides status: Secondary | ICD-10-CM | POA: Diagnosis not present

## 2019-02-06 DIAGNOSIS — H25811 Combined forms of age-related cataract, right eye: Secondary | ICD-10-CM | POA: Diagnosis not present

## 2019-02-06 HISTORY — PX: CATARACT EXTRACTION W/PHACO: SHX586

## 2019-02-06 LAB — POCT PREGNANCY, URINE: Preg Test, Ur: NEGATIVE

## 2019-02-06 SURGERY — PHACOEMULSIFICATION, CATARACT, WITH IOL INSERTION
Anesthesia: Monitor Anesthesia Care | Site: Eye | Laterality: Right

## 2019-02-06 MED ORDER — SODIUM HYALURONATE 10 MG/ML IO SOLN
INTRAOCULAR | Status: DC | PRN
  Administered 2019-02-06: 0.55 mL via INTRAOCULAR

## 2019-02-06 MED ORDER — ARMC OPHTHALMIC DILATING DROPS
1.0000 "application " | OPHTHALMIC | Status: DC | PRN
  Administered 2019-02-06 (×3): 1 via OPHTHALMIC

## 2019-02-06 MED ORDER — ONDANSETRON HCL 4 MG/2ML IJ SOLN: 4.0000 mg | Freq: Once | INTRAMUSCULAR | Status: DC | PRN

## 2019-02-06 MED ORDER — MIDAZOLAM HCL 2 MG/2ML IJ SOLN
INTRAMUSCULAR | Status: DC | PRN
  Administered 2019-02-06: 1 mg via INTRAVENOUS
  Administered 2019-02-06: 2 mg via INTRAVENOUS

## 2019-02-06 MED ORDER — LACTATED RINGERS IV SOLN: 10.0000 mL/h | INTRAVENOUS | Status: DC

## 2019-02-06 MED ORDER — TETRACAINE HCL 0.5 % OP SOLN
1.0000 [drp] | OPHTHALMIC | Status: DC | PRN
  Administered 2019-02-06 (×3): 1 [drp] via OPHTHALMIC

## 2019-02-06 MED ORDER — SODIUM HYALURONATE 23 MG/ML IO SOLN
INTRAOCULAR | Status: DC | PRN
  Administered 2019-02-06: 0.6 mL via INTRAOCULAR

## 2019-02-06 MED ORDER — LIDOCAINE HCL (PF) 2 % IJ SOLN
INTRAOCULAR | Status: DC | PRN
  Administered 2019-02-06: 1 mL via INTRAOCULAR

## 2019-02-06 MED ORDER — FENTANYL CITRATE (PF) 100 MCG/2ML IJ SOLN
INTRAMUSCULAR | Status: DC | PRN
  Administered 2019-02-06: 100 ug via INTRAVENOUS

## 2019-02-06 MED ORDER — EPINEPHRINE PF 1 MG/ML IJ SOLN
INTRAOCULAR | Status: DC | PRN
  Administered 2019-02-06: 71 mL via OPHTHALMIC

## 2019-02-06 MED ORDER — MOXIFLOXACIN HCL 0.5 % OP SOLN
OPHTHALMIC | Status: DC | PRN
  Administered 2019-02-06: 0.2 mL via OPHTHALMIC

## 2019-02-06 SURGICAL SUPPLY — 19 items
CANNULA ANT/CHMB 27G (MISCELLANEOUS) ×2 IMPLANT
CANNULA ANT/CHMB 27GA (MISCELLANEOUS) ×6 IMPLANT
DISSECTOR HYDRO NUCLEUS 50X22 (MISCELLANEOUS) ×3 IMPLANT
GLOVE SURG LX 7.5 STRW (GLOVE) ×2
GLOVE SURG LX STRL 7.5 STRW (GLOVE) ×1 IMPLANT
GLOVE SURG SYN 8.5  E (GLOVE) ×2
GLOVE SURG SYN 8.5 E (GLOVE) ×1 IMPLANT
GLOVE SURG SYN 8.5 PF PI (GLOVE) ×1 IMPLANT
GOWN STRL REUS W/ TWL LRG LVL3 (GOWN DISPOSABLE) ×2 IMPLANT
GOWN STRL REUS W/TWL LRG LVL3 (GOWN DISPOSABLE) ×4
LENS IOL TECNIS ITEC 11.5 (Intraocular Lens) ×2 IMPLANT
MARKER SKIN DUAL TIP RULER LAB (MISCELLANEOUS) ×3 IMPLANT
PACK DR. KING ARMS (PACKS) ×3 IMPLANT
PACK EYE AFTER SURG (MISCELLANEOUS) ×3 IMPLANT
PACK OPTHALMIC (MISCELLANEOUS) ×3 IMPLANT
SYR 3ML LL SCALE MARK (SYRINGE) ×3 IMPLANT
SYR TB 1ML LUER SLIP (SYRINGE) ×3 IMPLANT
WATER STERILE IRR 500ML POUR (IV SOLUTION) ×3 IMPLANT
WIPE NON LINTING 3.25X3.25 (MISCELLANEOUS) ×3 IMPLANT

## 2019-02-06 NOTE — H&P (Signed)

## 2019-02-06 NOTE — Anesthesia Procedure Notes (Signed)
Procedure Name: MAC Date/Time: 02/06/2019 12:04 PM Performed by: Janna Arch, CRNA Pre-anesthesia Checklist: Patient identified, Emergency Drugs available, Suction available, Timeout performed and Patient being monitored Patient Re-evaluated:Patient Re-evaluated prior to induction Oxygen Delivery Method: Nasal cannula Placement Confirmation: positive ETCO2

## 2019-02-06 NOTE — Op Note (Signed)
OPERATIVE NOTE  Ashley Paul [id number] 02/06/2019   PREOPERATIVE DIAGNOSIS:  Nuclear sclerotic cataract right eye.  H25.11   POSTOPERATIVE DIAGNOSIS:    Nuclear sclerotic cataract right eye.     PROCEDURE:  Phacoemusification with posterior chamber intraocular lens placement of the right eye   LENS:   Implant Name Type Inv. Item Serial No. Manufacturer Lot No. LRB No. Used  LENS IOL DIOP 11.5 - [id number] Intraocular Lens LENS IOL DIOP 11.5 [id number]  Right 1       PCB00 +11.5   ULTRASOUND TIME: 0 minutes 0 seconds.  CDE 0 (no phaco used).  Aspiration time 4:46.  Fluid 71 cc.   SURGEON:  Benay Pillow, MD, MPH  ANESTHESIOLOGIST: Anesthesiologist: Darrin Nipper, MD CRNA: Janna Arch, CRNA   ANESTHESIA:  Topical with tetracaine drops augmented with 1% preservative-free intracameral lidocaine.  ESTIMATED BLOOD LOSS: less than 1 mL.   COMPLICATIONS:  None.   DESCRIPTION OF PROCEDURE:  The patient was identified in the holding room and transported to the operating room and placed in the supine position under the operating microscope.  The right eye was identified as the operative eye and it was prepped and draped in the usual sterile ophthalmic fashion.   A 1.0 millimeter clear-corneal paracentesis was made at the 10:30 position. 0.5 ml of preservative-free 1% lidocaine with epinephrine was injected into the anterior chamber.  The anterior chamber was filled with Healon 5 viscoelastic.  A 2.4 millimeter keratome was used to make a near-clear corneal incision at the 8:00 position.  A curvilinear capsulorrhexis was made with a cystotome and capsulorrhexis forceps.  Balanced salt solution was used to hydrodissect and hydrodelineate the nucleus.   The lens was relatively soft and was aspirated with the phaco handpiece.  The remaining cortex was then removed using the irrigation and aspiration handpiece. Healon was then placed into the capsular bag to distend it for lens  placement.  A lens was then injected into the capsular bag.  The remaining viscoelastic was aspirated.   Wounds were hydrated with balanced salt solution.  The anterior chamber was inflated to a physiologic pressure with balanced salt solution.   Intracameral vigamox 0.1 mL undiluted was injected into the eye and a drop placed onto the ocular surface.  No wound leaks were noted.  The patient was taken to the recovery room in stable condition without complications of anesthesia or surgery  Benay Pillow 02/06/2019, 12:25 PM

## 2019-02-06 NOTE — Anesthesia Postprocedure Evaluation (Signed)
Anesthesia Post Note  Patient: Ashley Paul  Procedure(s) Performed: CATARACT EXTRACTION PHACO AND INTRAOCULAR LENS PLACEMENT ([place])  RIGHT (Right Eye)  Patient location during evaluation: PACU Anesthesia Type: MAC Level of consciousness: awake and alert, oriented and patient cooperative Pain management: pain level controlled Vital Signs Assessment: post-procedure vital signs reviewed and stable Respiratory status: spontaneous breathing, nonlabored ventilation and respiratory function stable Cardiovascular status: blood pressure returned to baseline and stable Postop Assessment: adequate PO intake Anesthetic complications: no    Darrin Nipper

## 2019-02-06 NOTE — Transfer of Care (Signed)
Immediate Anesthesia Transfer of Care Note  Patient: Ashley Paul  Procedure(s) Performed: CATARACT EXTRACTION PHACO AND INTRAOCULAR LENS PLACEMENT ([facility])  RIGHT (Right Eye)  Patient Location: PACU  Anesthesia Type: MAC  Level of Consciousness: awake, alert  and patient cooperative  Airway and Oxygen Therapy: Patient Spontanous Breathing and Patient connected to supplemental oxygen  Post-op Assessment: Post-op Vital signs reviewed, Patient's Cardiovascular Status Stable, Respiratory Function Stable, Patent Airway and No signs of Nausea or vomiting  Post-op Vital Signs: Reviewed and stable  Complications: No apparent anesthesia complications

## 2019-02-07 ENCOUNTER — Encounter: Payer: Self-pay | Admitting: Ophthalmology

## 2019-02-10 ENCOUNTER — Ambulatory Visit: Payer: Self-pay | Admitting: Surgery

## 2019-02-10 NOTE — H&P (Signed)
Surgical H&P  CC: morbid obesity  HPI: she returns for her preoperative visit. No changes in her health in the last 5 months.  She has ablated most of the preoperative workup with no barriers identified.  At our last discussion I had advised her that a sleeve might be better given her NSAID use for her arthritis.  She does endorse reflux which is currently well controlled on scheduled PPI but if she misses a dose she has really significant symptoms for several days.  She has changed to Tylenol with much better control of her arthritic pain.  She remains interested in Roux-en-Y gastric bypass due to concerns of exacerbating reflux after sleeve. Fortunately her brother has completed his back surgery 2 months ago and is doing very well.  She herself had cataract surgery 4 days ago and is recovering fine from this.  UGI/ CXR 10/21/18: negative, no HH or reflux Nutritionist: Gaylan Gerold 01/16/19: Approved, some behavioral issues that she will need to continue to work on. Psych: Buena Irish MSW/LCSW, 01/26/19: approved. Labs- she will be getting these later today or tomorrow  Initial consult 920/19: This is a very pleasant 48 year old woman who presents to discuss bariatric surgery. She has been dealing with weight for many years and has tried innumerable diets and exercise programs with limited success. Her current lifestyle is fairly well-balanced, no sugary drinks, she does have ice cream many nights a week. She states her family avoids processed food for the most part. No particular exercise regimen as she has significant arthritis and suffers with pain related to that. She did have cervical spine surgery by Dr. Cyndy Freeze about a year ago. She has also had a cholecystectomy. Tubal ligation, C-section No tobacco, alcohol or drug use. She works as a Public relations account executive. Her husband works in Architect. She has 4 grown children. She has diabetes versus prediabetes, hypertension, osteoarthritis, and  reflux which is well controlled with daily PPI. Also suffers with anxiety and depression, Family history significant for obesity, her father had gastric bypass and one of her brothers is in the process of arranging bariatric surgery.  Allergies  Allergen Reactions  . Azathioprine Other (See Comments)    Flu-like symptoms, tachycardia  . Bupropion Hives  . Lisinopril Cough  . Mycophenolate Mofetil Hives  . Sulfa Antibiotics Swelling    SWELLING REACTION UNSPECIFIED   . Pantoprazole Sodium Palpitations  . Tramadol Anxiety    Severe anxiety    Past Medical History:  Diagnosis Date  . Allergy   . Anxiety   . Arthritis    neck, lower back  . Depression   . GERD (gastroesophageal reflux disease)   . Goiter   . Hypertension   . Obesity     Past Surgical History:  Procedure Laterality Date  . BREAST CYST EXCISION Right 1993  . CATARACT EXTRACTION W/PHACO Left 12/26/2018   Procedure: CATARACT EXTRACTION PHACO AND INTRAOCULAR LENS PLACEMENT ([place])  LEFT;  Surgeon: Eulogio Bear, MD;  Location: [facility];  Service: Ophthalmology;  Laterality: Left;  . CATARACT EXTRACTION W/PHACO Right 02/06/2019   Procedure: CATARACT EXTRACTION PHACO AND INTRAOCULAR LENS PLACEMENT ([place])  RIGHT;  Surgeon: Eulogio Bear, MD;  Location: [facility];  Service: Ophthalmology;  Laterality: Right;  . CERVICAL DISC ARTHROPLASTY N/A 10/20/2017   Procedure: ARTIFICIAL DISC REPLACEMENT CERVICAL FIVE- CERVICAL SIX;  Surgeon: Ashok Pall, MD;  Location: [facility];  Service: Neurosurgery;  Laterality: N/A;  . CESAREAN SECTION    . CHOLECYSTECTOMY    .  EYE SURGERY Right    bx 2015  . TUBAL LIGATION      Family History  Problem Relation Age of Onset  . Heart disease Paternal Grandfather   . Colon cancer Neg Hx   . Esophageal cancer Neg Hx   . Pancreatic cancer Neg Hx   . Stomach cancer Neg Hx   . Liver disease Neg Hx   . Rectal cancer Neg Hx     Social History   Socioeconomic  History  . Marital status: Married    Spouse name: Not on file  . Number of children: Not on file  . Years of education: Not on file  . Highest education level: Not on file  Occupational History  . Not on file  Social Needs  . Financial resource strain: Not on file  . Food insecurity:    Worry: Not on file    Inability: Not on file  . Transportation needs:    Medical: Not on file    Non-medical: Not on file  Tobacco Use  . Smoking status: Former Smoker    Types: Cigarettes    Last attempt to quit: 2011    Years since quitting: 9.1  . Smokeless tobacco: Never Used  Substance and Sexual Activity  . Alcohol use: No  . Drug use: No  . Sexual activity: Yes    Birth control/protection: Surgical  Lifestyle  . Physical activity:    Days per week: Not on file    Minutes per session: Not on file  . Stress: Not on file  Relationships  . Social connections:    Talks on phone: Not on file    Gets together: Not on file    Attends religious service: Not on file    Active member of club or organization: Not on file    Attends meetings of clubs or organizations: Not on file    Relationship status: Not on file  Other Topics Concern  . Not on file  Social History Narrative  . Not on file    Current Outpatient Medications on File Prior to Visit  Medication Sig Dispense Refill  . albuterol (PROVENTIL HFA;VENTOLIN HFA) 108 (90 Base) MCG/ACT inhaler Inhale 2 puffs into the lungs every 4 (four) hours as needed for wheezing or shortness of breath. 1 Inhaler 0  . beclomethasone (QVAR REDIHALER) 80 MCG/ACT inhaler Inhale 1 puff into the lungs 2 (two) times daily. 1 Inhaler 0  . benzonatate (TESSALON) 200 MG capsule Take 1 capsule (200 mg total) by mouth 2 (two) times daily as needed for cough. (Patient not taking: Reported on 12/15/2018) 20 capsule 0  . BIOTIN PO Take by mouth daily.    . clindamycin (CLINDAGEL) 1 % gel Apply topically 2 (two) times daily. To Rash (Patient not taking:  Reported on 12/07/2018) 30 g 0  . hydrOXYzine (ATARAX/VISTARIL) 50 MG tablet   8  . ibuprofen (ADVIL,MOTRIN) 200 MG tablet Take 800 mg by mouth every 6 (six) hours as needed for headache or moderate pain.    Marland Kitchen losartan (COZAAR) 100 MG tablet Take 100 mg by mouth daily.    . montelukast (SINGULAIR) 10 MG tablet Take 1 tablet (10 mg total) by mouth at bedtime. (Patient not taking: Reported on 02/06/2019) 30 tablet 0  . naproxen sodium (ANAPROX) 220 MG tablet Take 440 mg by mouth 2 (two) times daily as needed (for headache or pain).    Marland Kitchen omeprazole (PRILOSEC) 20 MG capsule Take 1 capsule (20 mg total) by  mouth daily before breakfast. 60 capsule 3  . sertraline (ZOLOFT) 25 MG tablet Take 25 mg by mouth daily.    . Vitamin D, Ergocalciferol, (DRISDOL) 1.25 MG (50000 UT) CAPS capsule Take 1 capsul by mouth twice weekly     No current facility-administered medications on file prior to visit.     Review of Systems: a complete, 10pt review of systems was completed with pertinent positives and negatives as documented in the HPI  Physical Exam: 02/10/2019 3:52 PM Weight: 255.5 lb Height: 68in Body Surface Area: 2.27 m Body Mass Index: 38.85 kg/m  Temp.: 97.53F(Oral)  Pulse: 98 (Regular)  BP: 110/72 (Sitting, Left Arm, Standard)  Gen: alert and well appearing Eye: extraocular motion intact, no scleral icterus ENT: moist mucus membranes, dentition intact Neck: no mass or thyromegaly Chest: unlabored respirations, symmetrical air entry, clear bilaterally CV: regular rate and rhythm, no pedal edema Abdomen: soft, nontender, nondistended. No mass or organomegaly MSK: strength symmetrical throughout, no deformity Neuro: grossly intact, normal gait Psych: normal mood and affect, appropriate insight Skin: warm and dry, no rash or lesion on limited exam   CBC Latest Ref Rng & Units 10/11/2017 07/25/2014  WBC 4.0 - 10.5 K/uL 6.0 8.1  Hemoglobin 12.0 - 15.0 g/dL 13.4 13.5  Hematocrit 36.0  - 46.0 % 41.0 41.0  Platelets 150 - 400 K/uL 274 207    CMP Latest Ref Rng & Units 10/11/2017 07/25/2014  Glucose 65 - 99 mg/dL 153(H) 145(H)  BUN 6 - 20 mg/dL 15 12  Creatinine 0.44 - 1.00 mg/dL 0.72 0.93  Sodium 135 - 145 mmol/L 137 140  Potassium 3.5 - 5.1 mmol/L 3.7 3.5  Chloride 101 - 111 mmol/L 107 106  CO2 22 - 32 mmol/L 20(L) 25  Calcium 8.9 - 10.3 mg/dL 9.1 8.4(L)    No results found for: INR, PROTIME  Imaging: No results found.   A/P:  Morbid obesity.She has completed most of the workup, some labs are yet to be done.  No obstacles identified.  We again discussed the merits of sleeve versus bypass.  She remains committed to gastric bypass and I think this is reasonable now that she is off NSAIDs and given her history of reflux symptoms.  We discussed the surgery including technical aspects, the risks of bleeding, infection, pain, scarring, injury to intra-abdominal structures, staple line leak or abscess, chronic abdominal pain or nausea, bowel obstruction, nutritional deficiencies, marginal ulcer, DVT/PE, pneumonia, heart attack, stroke, death, failure to reach weight loss goals and weight regain, hernia.  Discussed the typical pre-, peri-, and postoperative course.  Discussed the importance of lifelong behavioral changes to combat the chronic and relapsing disease which is obesity.  Questions were welcomed and answered.we will proceed with Roux-en-Y gastric bypass pending authorization from her insurance.   Romana Juniper, MD [facility], [place] Pager [phone]

## 2019-02-10 NOTE — H&P (View-Only) (Signed)
Surgical H&P  CC: morbid obesity  HPI: she returns for her preoperative visit. No changes in her health in the last 5 months.  She has ablated most of the preoperative workup with no barriers identified.  At our last discussion I had advised her that a sleeve might be better given her NSAID use for her arthritis.  She does endorse reflux which is currently well controlled on scheduled PPI but if she misses a dose she has really significant symptoms for several days.  She has changed to Tylenol with much better control of her arthritic pain.  She remains interested in Roux-en-Y gastric bypass due to concerns of exacerbating reflux after sleeve. Fortunately her brother has completed his back surgery 2 months ago and is doing very well.  She herself had cataract surgery 4 days ago and is recovering fine from this.  UGI/ CXR 10/21/18: negative, no HH or reflux Nutritionist: Gaylan Gerold 01/16/19: Approved, some behavioral issues that she will need to continue to work on. Psych: Buena Irish MSW/LCSW, 01/26/19: approved. Labs- she will be getting these later today or tomorrow  Initial consult 920/19: This is a very pleasant 48 year old woman who presents to discuss bariatric surgery. She has been dealing with weight for many years and has tried innumerable diets and exercise programs with limited success. Her current lifestyle is fairly well-balanced, no sugary drinks, she does have ice cream many nights a week. She states her family avoids processed food for the most part. No particular exercise regimen as she has significant arthritis and suffers with pain related to that. She did have cervical spine surgery by Dr. Cyndy Freeze about a year ago. She has also had a cholecystectomy. Tubal ligation, C-section No tobacco, alcohol or drug use. She works as a Public relations account executive. Her husband works in Architect. She has 4 grown children. She has diabetes versus prediabetes, hypertension, osteoarthritis, and  reflux which is well controlled with daily PPI. Also suffers with anxiety and depression, Family history significant for obesity, her father had gastric bypass and one of her brothers is in the process of arranging bariatric surgery.  Allergies  Allergen Reactions  . Azathioprine Other (See Comments)    Flu-like symptoms, tachycardia  . Bupropion Hives  . Lisinopril Cough  . Mycophenolate Mofetil Hives  . Sulfa Antibiotics Swelling    SWELLING REACTION UNSPECIFIED   . Pantoprazole Sodium Palpitations  . Tramadol Anxiety    Severe anxiety    Past Medical History:  Diagnosis Date  . Allergy   . Anxiety   . Arthritis    neck, lower back  . Depression   . GERD (gastroesophageal reflux disease)   . Goiter   . Hypertension   . Obesity     Past Surgical History:  Procedure Laterality Date  . BREAST CYST EXCISION Right 1993  . CATARACT EXTRACTION W/PHACO Left 12/26/2018   Procedure: CATARACT EXTRACTION PHACO AND INTRAOCULAR LENS PLACEMENT ([place])  LEFT;  Surgeon: Eulogio Bear, MD;  Location: [facility];  Service: Ophthalmology;  Laterality: Left;  . CATARACT EXTRACTION W/PHACO Right 02/06/2019   Procedure: CATARACT EXTRACTION PHACO AND INTRAOCULAR LENS PLACEMENT ([place])  RIGHT;  Surgeon: Eulogio Bear, MD;  Location: [facility];  Service: Ophthalmology;  Laterality: Right;  . CERVICAL DISC ARTHROPLASTY N/A 10/20/2017   Procedure: ARTIFICIAL DISC REPLACEMENT CERVICAL FIVE- CERVICAL SIX;  Surgeon: Ashok Pall, MD;  Location: [facility];  Service: Neurosurgery;  Laterality: N/A;  . CESAREAN SECTION    . CHOLECYSTECTOMY    .  EYE SURGERY Right    bx 2015  . TUBAL LIGATION      Family History  Problem Relation Age of Onset  . Heart disease Paternal Grandfather   . Colon cancer Neg Hx   . Esophageal cancer Neg Hx   . Pancreatic cancer Neg Hx   . Stomach cancer Neg Hx   . Liver disease Neg Hx   . Rectal cancer Neg Hx     Social History   Socioeconomic  History  . Marital status: Married    Spouse name: Not on file  . Number of children: Not on file  . Years of education: Not on file  . Highest education level: Not on file  Occupational History  . Not on file  Social Needs  . Financial resource strain: Not on file  . Food insecurity:    Worry: Not on file    Inability: Not on file  . Transportation needs:    Medical: Not on file    Non-medical: Not on file  Tobacco Use  . Smoking status: Former Smoker    Types: Cigarettes    Last attempt to quit: 2011    Years since quitting: 9.1  . Smokeless tobacco: Never Used  Substance and Sexual Activity  . Alcohol use: No  . Drug use: No  . Sexual activity: Yes    Birth control/protection: Surgical  Lifestyle  . Physical activity:    Days per week: Not on file    Minutes per session: Not on file  . Stress: Not on file  Relationships  . Social connections:    Talks on phone: Not on file    Gets together: Not on file    Attends religious service: Not on file    Active member of club or organization: Not on file    Attends meetings of clubs or organizations: Not on file    Relationship status: Not on file  Other Topics Concern  . Not on file  Social History Narrative  . Not on file    Current Outpatient Medications on File Prior to Visit  Medication Sig Dispense Refill  . albuterol (PROVENTIL HFA;VENTOLIN HFA) 108 (90 Base) MCG/ACT inhaler Inhale 2 puffs into the lungs every 4 (four) hours as needed for wheezing or shortness of breath. 1 Inhaler 0  . beclomethasone (QVAR REDIHALER) 80 MCG/ACT inhaler Inhale 1 puff into the lungs 2 (two) times daily. 1 Inhaler 0  . benzonatate (TESSALON) 200 MG capsule Take 1 capsule (200 mg total) by mouth 2 (two) times daily as needed for cough. (Patient not taking: Reported on 12/15/2018) 20 capsule 0  . BIOTIN PO Take by mouth daily.    . clindamycin (CLINDAGEL) 1 % gel Apply topically 2 (two) times daily. To Rash (Patient not taking:  Reported on 12/07/2018) 30 g 0  . hydrOXYzine (ATARAX/VISTARIL) 50 MG tablet   8  . ibuprofen (ADVIL,MOTRIN) 200 MG tablet Take 800 mg by mouth every 6 (six) hours as needed for headache or moderate pain.    Marland Kitchen losartan (COZAAR) 100 MG tablet Take 100 mg by mouth daily.    . montelukast (SINGULAIR) 10 MG tablet Take 1 tablet (10 mg total) by mouth at bedtime. (Patient not taking: Reported on 02/06/2019) 30 tablet 0  . naproxen sodium (ANAPROX) 220 MG tablet Take 440 mg by mouth 2 (two) times daily as needed (for headache or pain).    Marland Kitchen omeprazole (PRILOSEC) 20 MG capsule Take 1 capsule (20 mg total) by  mouth daily before breakfast. 60 capsule 3  . sertraline (ZOLOFT) 25 MG tablet Take 25 mg by mouth daily.    . Vitamin D, Ergocalciferol, (DRISDOL) 1.25 MG (50000 UT) CAPS capsule Take 1 capsul by mouth twice weekly     No current facility-administered medications on file prior to visit.     Review of Systems: a complete, 10pt review of systems was completed with pertinent positives and negatives as documented in the HPI  Physical Exam: 02/10/2019 3:52 PM Weight: 255.5 lb Height: 68in Body Surface Area: 2.27 m Body Mass Index: 38.85 kg/m  Temp.: 97.59F(Oral)  Pulse: 98 (Regular)  BP: 110/72 (Sitting, Left Arm, Standard)  Gen: alert and well appearing Eye: extraocular motion intact, no scleral icterus ENT: moist mucus membranes, dentition intact Neck: no mass or thyromegaly Chest: unlabored respirations, symmetrical air entry, clear bilaterally CV: regular rate and rhythm, no pedal edema Abdomen: soft, nontender, nondistended. No mass or organomegaly MSK: strength symmetrical throughout, no deformity Neuro: grossly intact, normal gait Psych: normal mood and affect, appropriate insight Skin: warm and dry, no rash or lesion on limited exam   CBC Latest Ref Rng & Units 10/11/2017 07/25/2014  WBC 4.0 - 10.5 K/uL 6.0 8.1  Hemoglobin 12.0 - 15.0 g/dL 13.4 13.5  Hematocrit 36.0  - 46.0 % 41.0 41.0  Platelets 150 - 400 K/uL 274 207    CMP Latest Ref Rng & Units 10/11/2017 07/25/2014  Glucose 65 - 99 mg/dL 153(H) 145(H)  BUN 6 - 20 mg/dL 15 12  Creatinine 0.44 - 1.00 mg/dL 0.72 0.93  Sodium 135 - 145 mmol/L 137 140  Potassium 3.5 - 5.1 mmol/L 3.7 3.5  Chloride 101 - 111 mmol/L 107 106  CO2 22 - 32 mmol/L 20(L) 25  Calcium 8.9 - 10.3 mg/dL 9.1 8.4(L)    No results found for: INR, PROTIME  Imaging: No results found.   A/P:  Morbid obesity.She has completed most of the workup, some labs are yet to be done.  No obstacles identified.  We again discussed the merits of sleeve versus bypass.  She remains committed to gastric bypass and I think this is reasonable now that she is off NSAIDs and given her history of reflux symptoms.  We discussed the surgery including technical aspects, the risks of bleeding, infection, pain, scarring, injury to intra-abdominal structures, staple line leak or abscess, chronic abdominal pain or nausea, bowel obstruction, nutritional deficiencies, marginal ulcer, DVT/PE, pneumonia, heart attack, stroke, death, failure to reach weight loss goals and weight regain, hernia.  Discussed the typical pre-, peri-, and postoperative course.  Discussed the importance of lifelong behavioral changes to combat the chronic and relapsing disease which is obesity.  Questions were welcomed and answered.we will proceed with Roux-en-Y gastric bypass pending authorization from her insurance.   Romana Juniper, MD [facility], [place] Pager [phone]

## 2019-02-13 ENCOUNTER — Ambulatory Visit: Payer: Self-pay

## 2019-02-14 ENCOUNTER — Other Ambulatory Visit: Payer: Self-pay | Admitting: Family Medicine

## 2019-02-14 DIAGNOSIS — R928 Other abnormal and inconclusive findings on diagnostic imaging of breast: Secondary | ICD-10-CM

## 2019-02-20 ENCOUNTER — Encounter: Payer: 59 | Attending: Surgery | Admitting: Skilled Nursing Facility1

## 2019-02-20 DIAGNOSIS — E669 Obesity, unspecified: Secondary | ICD-10-CM | POA: Insufficient documentation

## 2019-02-23 NOTE — Progress Notes (Signed)
Pre-Operative Nutrition Class:  Appt start time: 4497   End time:  1830.  Patient was seen on 02/20/19 for Pre-Operative Bariatric Surgery Education at [facility].   Surgery date:  Surgery type: RYGB Start weight at [facility]: 253.7 Weight today: 257.1  Samples given per MNT protocol. Patient educated on appropriate usage: Bariatric Advantage Multivitamin Lot #[id number] Exp:03/2020  Bariatric Advantage Calcium  Lot #[id number] Exp:12/12/2019  Premier Protein Shake Lot #[id number] Exp: 04/05/2019  The following the learning objectives were met by the patient during this course:  Identify Pre-Op Dietary Goals and will begin 2 weeks pre-operatively  Identify appropriate sources of fluids and proteins   State protein recommendations and appropriate sources pre and post-operatively  Identify Post-Operative Dietary Goals and will follow for 2 weeks post-operatively  Identify appropriate multivitamin and calcium sources  Describe the need for physical activity post-operatively and will follow MD recommendations  State when to call healthcare provider regarding medication questions or post-operative complications  Handouts given during class include:  Pre-Op Bariatric Surgery Diet Handout  Protein Shake Handout  Post-Op Bariatric Surgery Nutrition Handout  BELT Program Information Flyer  Support Group Information Flyer  [facility] Bariatric Supplements Price List  Follow-Up Plan: Patient will follow-up at [facility] 2 weeks post operatively for diet advancement per MD.

## 2019-02-25 IMAGING — MR MR LUMBAR SPINE W/O CM
5 series · 38 of 48 positions shown · non-contrast
Comparison: None.

CLINICAL DATA: Osteoarthritis of spine with radiculopathy. Right
leg pain

EXAM:
MRI LUMBAR SPINE WITHOUT CONTRAST
TECHNIQUE: Multiplanar, multisequence MR imaging of the lumbar spine was
performed. No intravenous contrast was administered.

[Series 2: T2 · sagittal · 4.0mm · 0.81mm/px · 6 of 15 slices shown (1 of 2)]
[im 1/15]
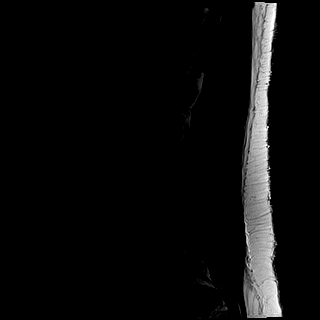
[im 3/15]
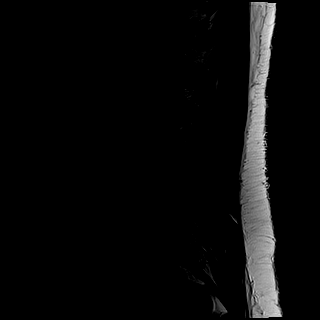
[im 6/15]
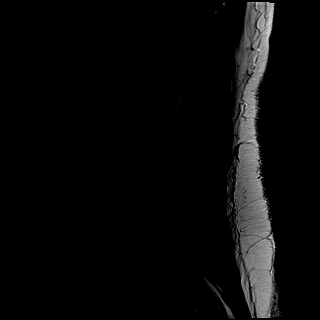
[im 9/15]
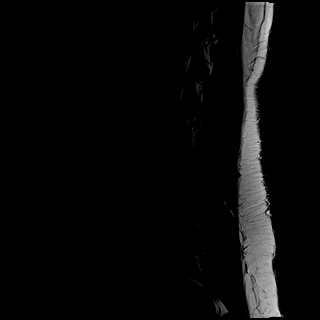
[im 12/15]
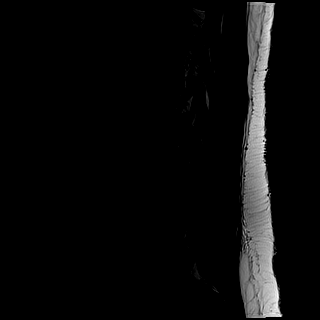
[im 15/15]
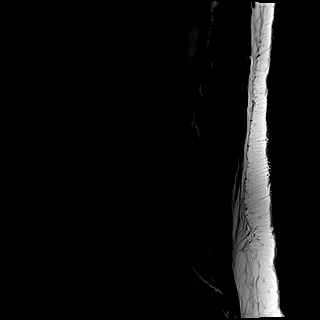

[Series 3: T1 · sagittal · 4.0mm · 0.81mm/px · 6 of 15 slices shown (1 of 2)]
[im 1/15]
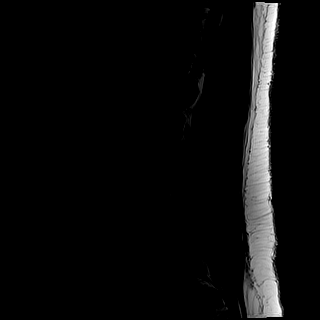
[im 3/15]
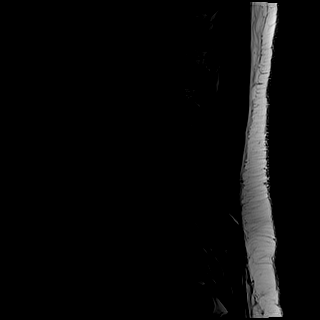
[im 6/15]
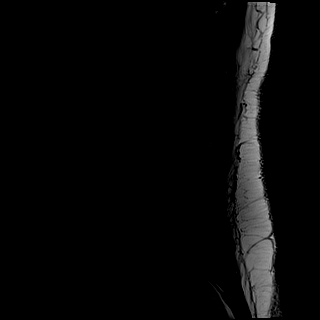
[im 9/15]
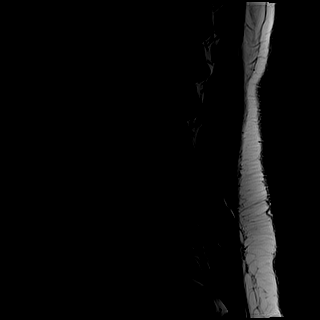
[im 12/15]
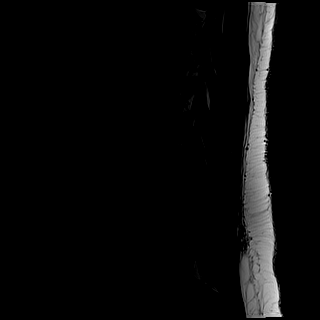
[im 15/15]
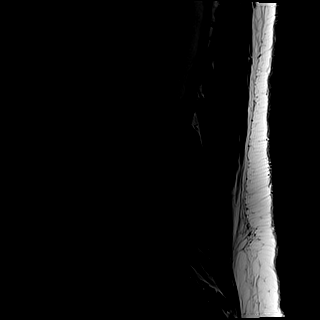

[Series 4: STIR · sagittal · 4.0mm · 1.02mm/px · 6 of 15 slices shown]
[im 1/15]
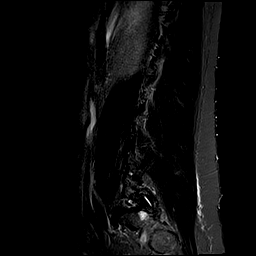
[im 3/15]
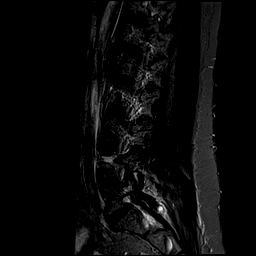
[im 6/15]
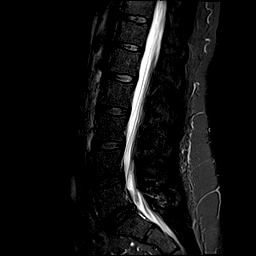
[im 9/15]
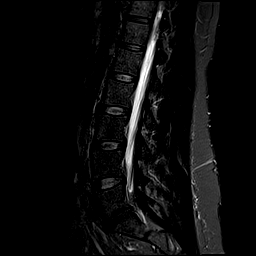
[im 12/15]
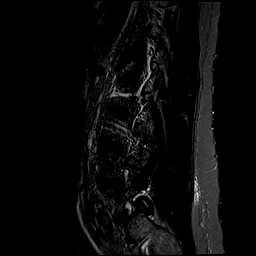
[im 15/15]
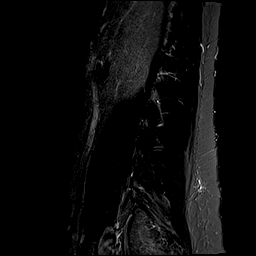

[Series 5: T2 · axial · 4.0mm · 0.78mm/px · z∈[-99,+103]mm · 11 of 39 slices shown (2 of 2)]
[im 1/39]
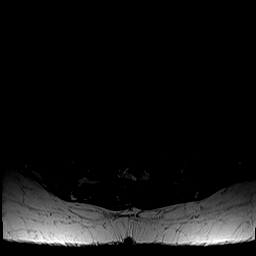
[im 3/39]
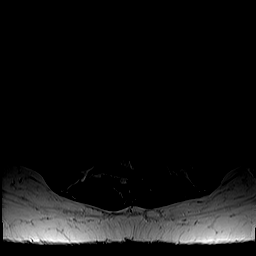
[im 6/39]
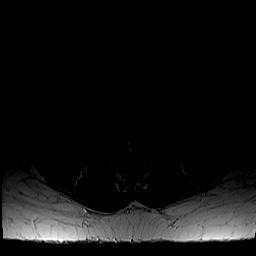
[im 9/39]
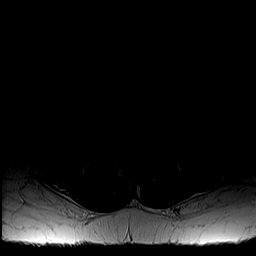
[im 11/39]
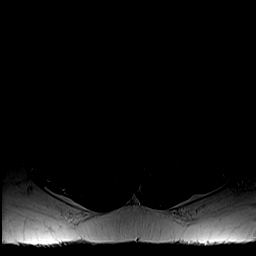
[im 17/39]
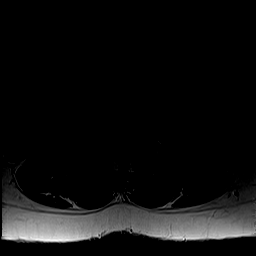
[im 20/39]
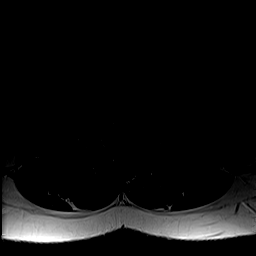
[im 22/39]
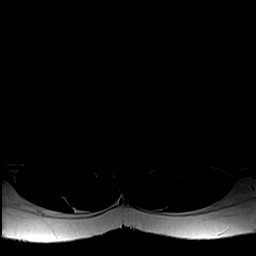
[im 28/39]
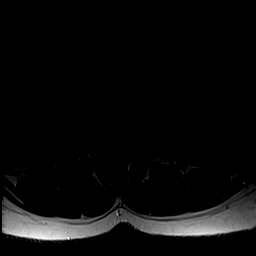
[im 33/39]
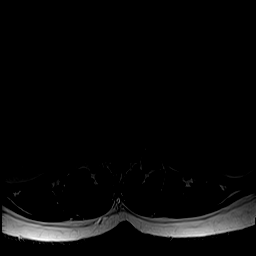
[im 39/39]
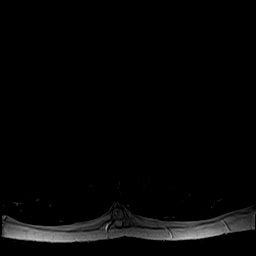

[Series 6: T1 · axial · 4.0mm · 0.39mm/px · z∈[-99,+103]mm · 9 of 39 slices shown (2 of 2)]
[im 1/39]
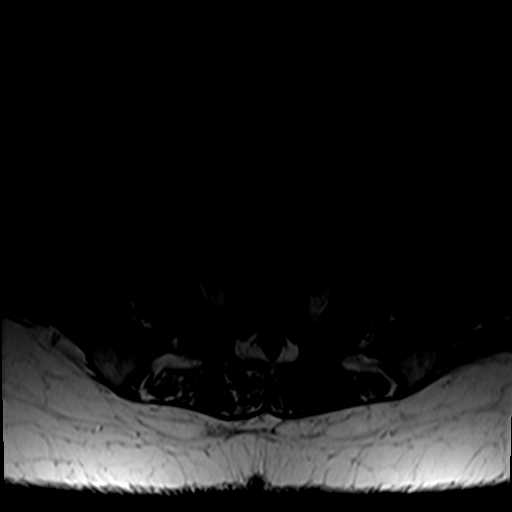
[im 6/39]
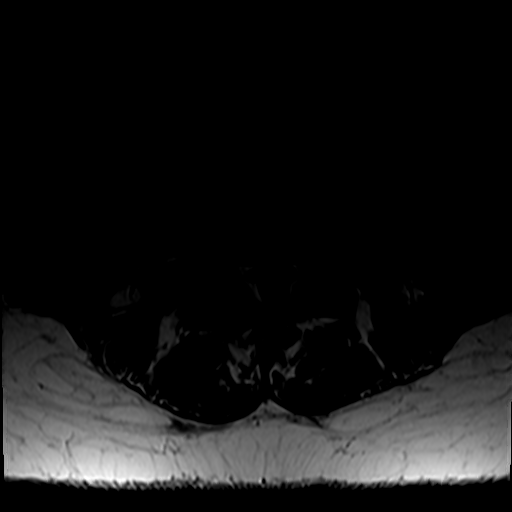
[im 11/39]
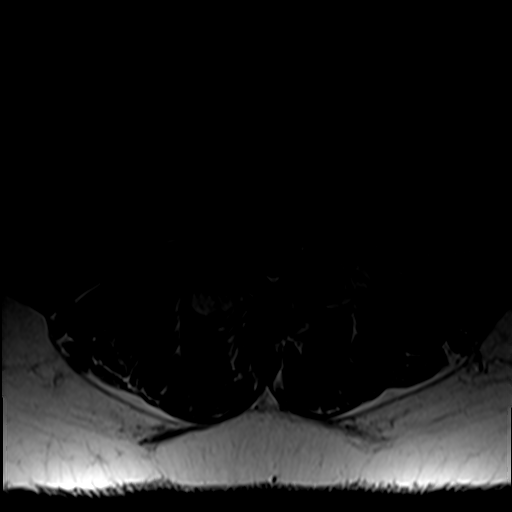
[im 17/39]
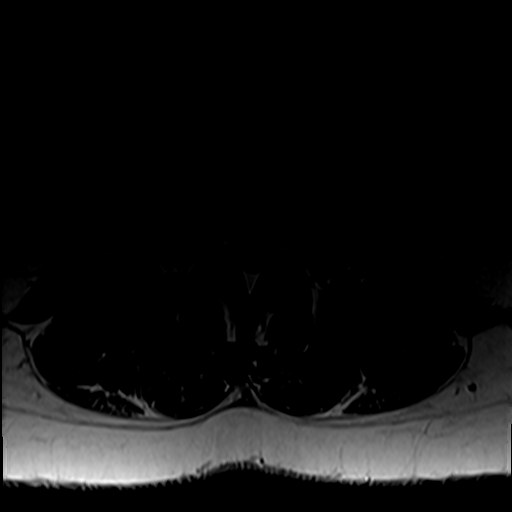
[im 20/39]
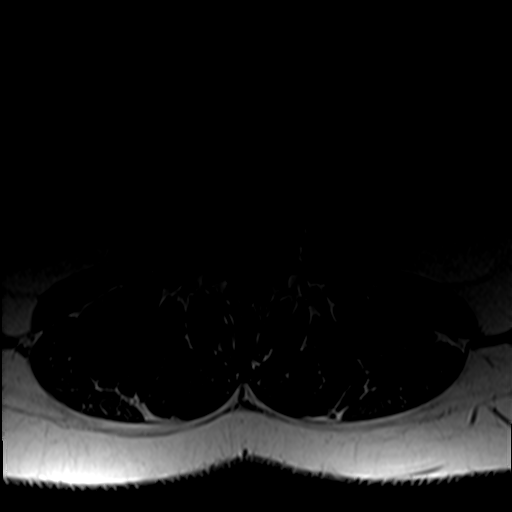
[im 22/39]
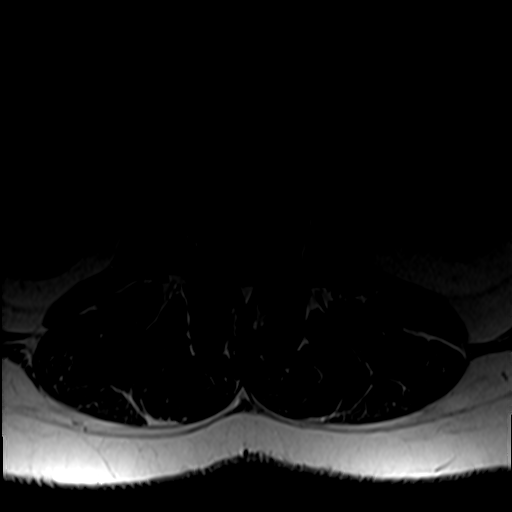
[im 28/39]
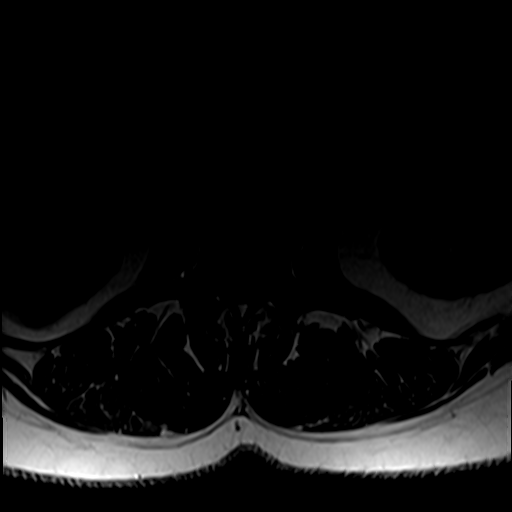
[im 33/39]
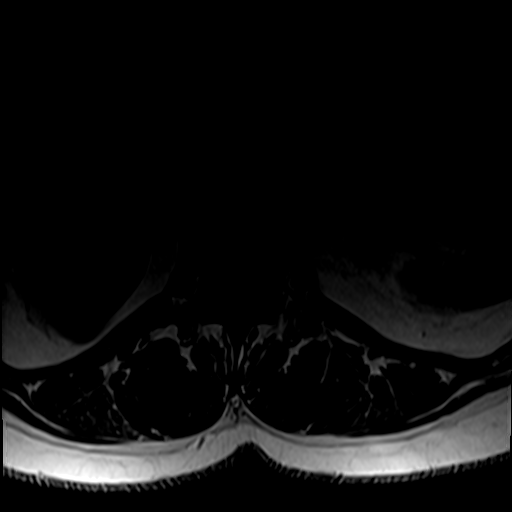
[im 39/39]
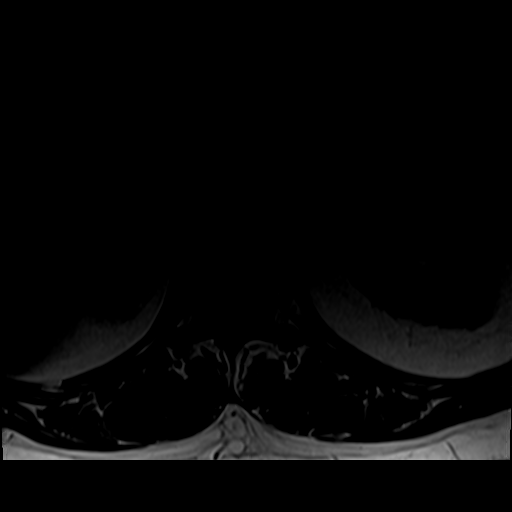

[38 of 48 positions shown; findings below may reference images not displayed]

FINDINGS: Segmentation:  Normal

Alignment:  Normal

Vertebrae: Negative for fracture or mass. Edema in the pedicle of L5
on the right appears related to facet degeneration.

Conus medullaris: Extends to the L2 level and appears normal.

Paraspinal and other soft tissues: Paraspinous muscles normal.
Retroperitoneal structures normal

Disc levels:

L1-2:  Negative

L2-3:  Negative

L3-4:  Negative

L4-5: Mild disc bulging and mild facet degeneration. Negative for
spinal or foraminal stenosis

L5-S1: Shallow right-sided disc protrusion extending into the
foramina. Bilateral facet hypertrophy right greater than left.
Ligamentum flavum hypertrophy right greater than left. 5 mm synovial
cyst projecting into the lateral foramen on the right with
impingement of the right L5 nerve root.
IMPRESSION: Right L5 nerve root impingement due to small disc protrusion, facet
hypertrophy, and 5 mm synovial cyst in the lateral foramen.

## 2019-03-03 NOTE — Patient Instructions (Addendum)
Ashley Paul  03/03/2019   Your procedure is scheduled on: 03-07-19    Report to [facility] Main  Entrance    Report to Short Stay at 5:30 AM    Call this number if you have problems the morning of surgery [phone]    Remember: MORNING OF SURGERY DRINK: 1 [place], DRINK ALL OF IT AT [place].  NO SOLID FOOD AFTER 600 PM THE NIGHT BEFORE YOUR SURGERY. YOU MAY DRINK CLEAR FLUIDS. THE GATORADE YOU DRINK BEFORE YOU LEAVE HOME WILL BE THE LAST FLUIDS YOU DRINK BEFORE SURGERY.    CLEAR LIQUID DIET   Foods Allowed                                                                     Foods Excluded  Coffee and tea, regular and decaf                             liquids that you cannot  Plain Jell-O in any flavor                                             see through such as: Fruit ices (not with fruit pulp)                                     milk, soups, orange juice  Iced Popsicles                                    All solid food Carbonated beverages, regular and diet                                    Cranberry, grape and apple juices Sports drinks like Gatorade Lightly seasoned clear broth or consume(fat free) Sugar, honey syrup  Sample Menu Breakfast                                Lunch                                     Supper Cranberry juice                    Beef broth                            Chicken broth Jell-O  Grape juice                           Apple juice Coffee or tea                        Jell-O                                      Popsicle                                                Coffee or tea                        Coffee or tea  _____________________________________________________________________   PAIN IS EXPECTED AFTER SURGERY AND WILL NOT BE COMPLETELY ELIMINATED. AMBULATION AND TYLENOL WILL HELP REDUCE INCISIONAL AND GAS PAIN. MOVEMENT IS KEY!  YOU ARE  EXPECTED TO BE OUT OF BED WITHIN 4 HOURS OF ADMISSION TO YOUR PATIENT ROOM.  SITTING IN THE RECLINER THROUGHOUT THE DAY IS IMPORTANT FOR DRINKING FLUIDS AND MOVING GAS THROUGHOUT THE GI TRACT.  COMPRESSION STOCKINGS SHOULD BE WORN [place] UNLESS YOU ARE WALKING.   INCENTIVE SPIROMETER SHOULD BE USED EVERY HOUR WHILE AWAKE TO DECREASE POST-OPERATIVE COMPLICATIONS SUCH AS PNEUMONIA.  WHEN DISCHARGED HOME, IT IS IMPORTANT TO CONTINUE TO WALK EVERY HOUR AND USE THE INCENTIVE SPIROMETER EVERY HOUR.    BRUSH YOUR TEETH MORNING OF SURGERY AND RINSE YOUR MOUTH OUT, NO CHEWING GUM CANDY OR MINTS.     Take these medicines the morning of surgery with A SIP OF WATER: Omeprazole (Prilosec)                                  You may not have any metal on your body including hair pins and              piercings  Do not wear jewelry, make-up, lotions, powders or perfumes, deodorant             Do not wear nail polish.  Do not shave  48 hours prior to surgery.                 Do not bring valuables to the hospital. Ashley Paul.  Contacts, dentures or bridgework may not be worn into surgery.  Leave suitcase in the car. After surgery it may be brought to your room.     Patients discharged the day of surgery will not be allowed to drive home. IF YOU ARE HAVING SURGERY AND GOING HOME THE SAME DAY, YOU MUST HAVE AN ADULT TO DRIVE YOU HOME AND BE WITH YOU FOR 24 HOURS. YOU MAY GO HOME BY TAXI OR UBER OR ORTHERWISE, BUT AN ADULT MUST ACCOMPANY YOU HOME AND STAY WITH YOU FOR 24 HOURS.   Special Instructions: N/A              Please read over the following fact sheets you were given: _____________________________________________________________________  [place] - Preparing for Surgery Before surgery, you can play an important role.  Because skin is not sterile, your skin needs to be as free of germs as possible.  You can  reduce the number of germs on your skin by washing with CHG (chlorahexidine gluconate) soap before surgery.  CHG is an antiseptic cleaner which kills germs and bonds with the skin to continue killing germs even after washing. Please DO NOT use if you have an allergy to CHG or antibacterial soaps.  If your skin becomes reddened/irritated stop using the CHG and inform your nurse when you arrive at Short Stay. Do not shave (including legs and underarms) for at least 48 hours prior to the first CHG shower.  You may shave your face/neck. Please follow these instructions carefully:  1.  Shower with CHG Soap the night before surgery and the  morning of Surgery.  2.  If you choose to wash your hair, wash your hair first as usual with your  normal  shampoo.  3.  After you shampoo, rinse your hair and body thoroughly to remove the  shampoo.                           4.  Use CHG as you would any other liquid soap.  You can apply chg directly  to the skin and wash                       Gently with a scrungie or clean washcloth.  5.  Apply the CHG Soap to your body ONLY FROM THE NECK DOWN.   Do not use on face/ open                           Wound or open sores. Avoid contact with eyes, ears mouth and genitals (private parts).                       Wash face,  Genitals (private parts) with your normal soap.             6.  Wash thoroughly, paying special attention to the area where your surgery  will be performed.  7.  Thoroughly rinse your body with warm water from the neck down.  8.  DO NOT shower/wash with your normal soap after using and rinsing off  the CHG Soap.                9.  Pat yourself dry with a clean towel.            10.  Wear clean pajamas.            11.  Place clean sheets on your bed the night of your first shower and do not  sleep with pets. Day of Surgery : Do not apply any lotions/deodorants the morning of surgery.  Please wear clean clothes to the hospital/surgery center.  FAILURE TO  FOLLOW THESE INSTRUCTIONS MAY RESULT IN THE CANCELLATION OF YOUR SURGERY PATIENT SIGNATURE_________________________________  NURSE SIGNATURE__________________________________  ________________________________________________________________________   Ashley Paul  An incentive spirometer is a tool that can help keep your lungs clear and active. This tool measures how well you are filling your lungs with each breath. Taking long deep breaths may help reverse or decrease the chance of developing breathing (pulmonary) problems (especially infection) following:  A long period of  time when you are unable to move or be active. BEFORE THE PROCEDURE   If the spirometer includes an indicator to show your best effort, your nurse or respiratory therapist will set it to a desired goal.  If possible, sit up straight or lean slightly forward. Try not to slouch.  Hold the incentive spirometer in an upright position. INSTRUCTIONS FOR USE  1. Sit on the edge of your bed if possible, or sit up as far as you can in bed or on a chair. 2. Hold the incentive spirometer in an upright position. 3. Breathe out normally. 4. Place the mouthpiece in your mouth and seal your lips tightly around it. 5. Breathe in slowly and as deeply as possible, raising the piston or the ball toward the top of the column. 6. Hold your breath for 3-5 seconds or for as long as possible. Allow the piston or ball to fall to the bottom of the column. 7. Remove the mouthpiece from your mouth and breathe out normally. 8. Rest for a few seconds and repeat Steps 1 through 7 at least 10 times every 1-2 hours when you are awake. Take your time and take a few normal breaths between deep breaths. 9. The spirometer may include an indicator to show your best effort. Use the indicator as a goal to work toward during each repetition. 10. After each set of 10 deep breaths, practice coughing to be sure your lungs are clear. If you have an  incision (the cut made at the time of surgery), support your incision when coughing by placing a pillow or rolled up towels firmly against it. Once you are able to get out of bed, walk around indoors and cough well. You may stop using the incentive spirometer when instructed by your caregiver.  RISKS AND COMPLICATIONS  Take your time so you do not get dizzy or light-headed.  If you are in pain, you may need to take or ask for pain medication before doing incentive spirometry. It is harder to take a deep breath if you are having pain. AFTER USE  Rest and breathe slowly and easily.  It can be helpful to keep track of a log of your progress. Your caregiver can provide you with a simple table to help with this. If you are using the spirometer at home, follow these instructions: [place] IF:   You are having difficultly using the spirometer.  You have trouble using the spirometer as often as instructed.  Your pain medication is not giving enough relief while using the spirometer.  You develop fever of 100.5 F (38.1 C) or higher. SEEK IMMEDIATE MEDICAL CARE IF:   You cough up bloody sputum that had not been present before.  You develop fever of 102 F (38.9 C) or greater.  You develop worsening pain at or near the incision site. MAKE SURE YOU:   Understand these instructions.  Will watch your condition.  Will get help right away if you are not doing well or get worse. Document Released: 04/26/2007 Document Revised: 03/07/2012 Document Reviewed: 06/27/2007 ExitCare Patient Information 2014 ExitCare, [place].   ________________________________________________________________________  WHAT IS A BLOOD TRANSFUSION? Blood Transfusion Information  A transfusion is the replacement of blood or some of its parts. Blood is made up of multiple cells which provide different functions.  Red blood cells carry oxygen and are used for blood loss replacement.  White blood cells  fight against infection.  Platelets control bleeding.  Plasma helps clot blood.  Other blood products are available for specialized needs, such as hemophilia or other clotting disorders. BEFORE THE TRANSFUSION  Who gives blood for transfusions?   Healthy volunteers who are fully evaluated to make sure their blood is safe. This is blood bank blood. Transfusion therapy is the safest it has ever been in the practice of medicine. Before blood is taken from a donor, a complete history is taken to make sure that person has no history of diseases nor engages in risky social behavior (examples are intravenous drug use or sexual activity with multiple partners). The donor's travel history is screened to minimize risk of transmitting infections, such as malaria. The donated blood is tested for signs of infectious diseases, such as HIV and hepatitis. The blood is then tested to be sure it is compatible with you in order to minimize the chance of a transfusion reaction. If you or a relative donates blood, this is often done in anticipation of surgery and is not appropriate for emergency situations. It takes many days to process the donated blood. RISKS AND COMPLICATIONS Although transfusion therapy is very safe and saves many lives, the main dangers of transfusion include:   Getting an infectious disease.  Developing a transfusion reaction. This is an allergic reaction to something in the blood you were given. Every precaution is taken to prevent this. The decision to have a blood transfusion has been considered carefully by your caregiver before blood is given. Blood is not given unless the benefits outweigh the risks. AFTER THE TRANSFUSION  Right after receiving a blood transfusion, you will usually feel much better and more energetic. This is especially true if your red blood cells have gotten low (anemic). The transfusion raises the level of the red blood cells which carry oxygen, and this usually  causes an energy increase.  The nurse administering the transfusion will monitor you carefully for complications. HOME CARE INSTRUCTIONS  No special instructions are needed after a transfusion. You may find your energy is better. Speak with your caregiver about any limitations on activity for underlying diseases you may have. SEEK MEDICAL CARE IF:   Your condition is not improving after your transfusion.  You develop redness or irritation at the intravenous (IV) site. SEEK IMMEDIATE MEDICAL CARE IF:  Any of the following symptoms occur over the next 12 hours:  Shaking chills.  You have a temperature by mouth above 102 F (38.9 C), not controlled by medicine.  Chest, back, or muscle pain.  People around you feel you are not acting correctly or are confused.  Shortness of breath or difficulty breathing.  Dizziness and fainting.  You get a rash or develop hives.  You have a decrease in urine output.  Your urine turns a dark color or changes to pink, red, or brown. Any of the following symptoms occur over the next 10 days:  You have a temperature by mouth above 102 F (38.9 C), not controlled by medicine.  Shortness of breath.  Weakness after normal activity.  The white part of the eye turns yellow (jaundice).  You have a decrease in the amount of urine or are urinating less often.  Your urine turns a dark color or changes to pink, red, or brown. Document Released: 12/11/2000 Document Revised: 03/07/2012 Document Reviewed: 07/30/2008 [facility] Patient Information 2014 [place], [place].  _______________________________________________________________________

## 2019-03-06 ENCOUNTER — Encounter (HOSPITAL_COMMUNITY)
Admission: RE | Admit: 2019-03-06 | Discharge: 2019-03-06 | Disposition: A | Discharge status: Home / Self Care | Payer: 59 | Source: Ambulatory Visit | Attending: Surgery | Admitting: Surgery

## 2019-03-06 ENCOUNTER — Other Ambulatory Visit: Payer: Self-pay | Admitting: *Deleted

## 2019-03-06 ENCOUNTER — Other Ambulatory Visit: Payer: Self-pay

## 2019-03-06 ENCOUNTER — Encounter (HOSPITAL_COMMUNITY): Payer: Self-pay

## 2019-03-06 DIAGNOSIS — Z885 Allergy status to narcotic agent status: Secondary | ICD-10-CM | POA: Diagnosis not present

## 2019-03-06 DIAGNOSIS — R7303 Prediabetes: Secondary | ICD-10-CM | POA: Diagnosis not present

## 2019-03-06 DIAGNOSIS — F329 Major depressive disorder, single episode, unspecified: Secondary | ICD-10-CM | POA: Diagnosis not present

## 2019-03-06 DIAGNOSIS — F419 Anxiety disorder, unspecified: Secondary | ICD-10-CM | POA: Diagnosis not present

## 2019-03-06 DIAGNOSIS — K760 Fatty (change of) liver, not elsewhere classified: Secondary | ICD-10-CM | POA: Diagnosis not present

## 2019-03-06 DIAGNOSIS — Z6838 Body mass index (BMI) 38.0-38.9, adult: Secondary | ICD-10-CM | POA: Insufficient documentation

## 2019-03-06 DIAGNOSIS — Z01812 Encounter for preprocedural laboratory examination: Secondary | ICD-10-CM

## 2019-03-06 DIAGNOSIS — I10 Essential (primary) hypertension: Secondary | ICD-10-CM | POA: Diagnosis not present

## 2019-03-06 DIAGNOSIS — K219 Gastro-esophageal reflux disease without esophagitis: Secondary | ICD-10-CM | POA: Diagnosis not present

## 2019-03-06 LAB — CBC WITH DIFFERENTIAL/PLATELET
ABS IMMATURE GRANULOCYTES: 0.01 10*3/uL (ref 0.00–0.07)
BASOS PCT: 2 %
Basophils Absolute: 0.1 10*3/uL (ref 0.0–0.1)
Eosinophils Absolute: 0.3 10*3/uL (ref 0.0–0.5)
Eosinophils Relative: 4 %
HCT: 44.3 % (ref 36.0–46.0)
Hemoglobin: 13.7 g/dL (ref 12.0–15.0)
Immature Granulocytes: 0 %
Lymphocytes Relative: 34 %
Lymphs Abs: 2.2 10*3/uL (ref 0.7–4.0)
MCH: 27.3 pg (ref 26.0–34.0)
MCHC: 30.9 g/dL (ref 30.0–36.0)
MCV: 88.2 fL (ref 80.0–100.0)
Monocytes Absolute: 0.4 10*3/uL (ref 0.1–1.0)
Monocytes Relative: 7 %
NEUTROS ABS: 3.5 10*3/uL (ref 1.7–7.7)
Neutrophils Relative %: 53 %
PLATELETS: 297 10*3/uL (ref 150–400)
RBC: 5.02 MIL/uL (ref 3.87–5.11)
RDW: 13.3 % (ref 11.5–15.5)
WBC: 6.5 10*3/uL (ref 4.0–10.5)
nRBC: 0 % (ref 0.0–0.2)

## 2019-03-06 LAB — COMPREHENSIVE METABOLIC PANEL
ALT: 102 U/L — ABNORMAL HIGH (ref 0–44)
AST: 77 U/L — ABNORMAL HIGH (ref 15–41)
Albumin: 4.7 g/dL (ref 3.5–5.0)
Alkaline Phosphatase: 57 U/L (ref 38–126)
Anion gap: 10 (ref 5–15)
BUN: 16 mg/dL (ref 6–20)
CHLORIDE: 105 mmol/L (ref 98–111)
CO2: 24 mmol/L (ref 22–32)
Calcium: 9.6 mg/dL (ref 8.9–10.3)
Creatinine, Ser: 0.73 mg/dL (ref 0.44–1.00)
GFR calc Af Amer: >60 mL/min (ref >60)
GFR calc non Af Amer: >60 mL/min (ref >60)
Glucose, Bld: 121 mg/dL — ABNORMAL HIGH (ref 70–99)
Potassium: 4 mmol/L (ref 3.5–5.1)
SODIUM: 139 mmol/L (ref 135–145)
Total Bilirubin: 0.6 mg/dL (ref 0.3–1.2)
Total Protein: 7.9 g/dL (ref 6.5–8.1)

## 2019-03-06 LAB — ABO/RH: ABO/RH(D): A POS

## 2019-03-06 MED ORDER — SODIUM CHLORIDE 0.9 % IV SOLN
2.0000 g | INTRAVENOUS | Status: DC
  Filled 2019-03-06: qty 2

## 2019-03-06 NOTE — Progress Notes (Signed)
10-21-18 (Epic) EKG, CXR

## 2019-03-06 NOTE — Patient Outreach (Addendum)
[facility]) Care Management  03/06/2019  Ashley Paul [birth year] [id number]  Preoperative Screening Call  Referral received: 02/16/19 Surgery/Procedure date: 03/07/19 Initial outreach: 03/06/19 Insurance: [place] Choice Plan  Subjective: Initial successful telephone call to patient's mobile number in order to complete preoperative screening. 2 HIPAA identifiers verified. Discussed purpose of preoperative call. Patient voices understanding and agrees to call. She states she understands the reasons for the surgery and the expected time of arrival. She says he/she completed her pre-op testing today and has no additional questions. She says she expects to be in the hospital 2-3 days. She states she has completed medical leave paperwork. She says she will have 24/7 care at home provided by her husband to assist in her recovery. She agrees to a post hospital discharge transition of care call.    Objective: Per the electronic medical record, Ashley Paul is scheduled for laparoscopic gastric sleeve surgery on 03/07/19 at [facility]. She completed her pre-op admission testing today .  Comorbidities include: HTN, Hypertriglyceridemia, Chronic lumbar radiculopathy, Granuloma of orbit, Orbital tumor,and Prediabetes   Assessment: Preoperative call completed, no preoperative needs identified.   Plan: RNCM will call patient for transition of care outreach within 72 hours of hospital discharge notification.  Barrington Ellison RN,CCM,CDE [place] Management Coordinator Office Phone [phone] Office Fax [phone]

## 2019-03-07 ENCOUNTER — Encounter (HOSPITAL_COMMUNITY)
Admission: RE | Disposition: A | Discharge status: Home / Self Care | Payer: Self-pay | Source: Home / Self Care | Attending: Surgery

## 2019-03-07 ENCOUNTER — Encounter (HOSPITAL_COMMUNITY): Payer: Self-pay

## 2019-03-07 ENCOUNTER — Ambulatory Visit: Payer: 59 | Admitting: Psychology

## 2019-03-07 ENCOUNTER — Inpatient Hospital Stay (HOSPITAL_COMMUNITY): Payer: 59 | Admitting: Certified Registered Nurse Anesthetist

## 2019-03-07 ENCOUNTER — Inpatient Hospital Stay (HOSPITAL_COMMUNITY)
Admission: RE | Admit: 2019-03-07 | Discharge: 2019-03-09 | DRG: 621 | Disposition: A | Discharge status: Home / Self Care | Payer: 59 | Attending: Surgery | Admitting: Surgery

## 2019-03-07 ENCOUNTER — Inpatient Hospital Stay (HOSPITAL_COMMUNITY): Payer: 59 | Admitting: Physician Assistant

## 2019-03-07 DIAGNOSIS — Z8249 Family history of ischemic heart disease and other diseases of the circulatory system: Secondary | ICD-10-CM

## 2019-03-07 DIAGNOSIS — Z888 Allergy status to other drugs, medicaments and biological substances status: Secondary | ICD-10-CM

## 2019-03-07 DIAGNOSIS — Z6838 Body mass index (BMI) 38.0-38.9, adult: Secondary | ICD-10-CM | POA: Diagnosis not present

## 2019-03-07 DIAGNOSIS — Z885 Allergy status to narcotic agent status: Secondary | ICD-10-CM | POA: Diagnosis not present

## 2019-03-07 DIAGNOSIS — F329 Major depressive disorder, single episode, unspecified: Secondary | ICD-10-CM | POA: Diagnosis present

## 2019-03-07 DIAGNOSIS — Z791 Long term (current) use of non-steroidal anti-inflammatories (NSAID): Secondary | ICD-10-CM | POA: Diagnosis not present

## 2019-03-07 DIAGNOSIS — K760 Fatty (change of) liver, not elsewhere classified: Secondary | ICD-10-CM | POA: Diagnosis present

## 2019-03-07 DIAGNOSIS — Z9049 Acquired absence of other specified parts of digestive tract: Secondary | ICD-10-CM | POA: Diagnosis not present

## 2019-03-07 DIAGNOSIS — F419 Anxiety disorder, unspecified: Secondary | ICD-10-CM | POA: Diagnosis present

## 2019-03-07 DIAGNOSIS — Z7951 Long term (current) use of inhaled steroids: Secondary | ICD-10-CM | POA: Diagnosis not present

## 2019-03-07 DIAGNOSIS — K219 Gastro-esophageal reflux disease without esophagitis: Secondary | ICD-10-CM | POA: Diagnosis not present

## 2019-03-07 DIAGNOSIS — Z79899 Other long term (current) drug therapy: Secondary | ICD-10-CM

## 2019-03-07 DIAGNOSIS — R7303 Prediabetes: Secondary | ICD-10-CM | POA: Diagnosis not present

## 2019-03-07 DIAGNOSIS — I10 Essential (primary) hypertension: Secondary | ICD-10-CM | POA: Diagnosis not present

## 2019-03-07 HISTORY — PX: GASTRIC ROUX-EN-Y: SHX5262

## 2019-03-07 LAB — TYPE AND SCREEN
ABO/RH(D): A POS
ANTIBODY SCREEN: NEGATIVE

## 2019-03-07 LAB — PREGNANCY, URINE: Preg Test, Ur: NEGATIVE

## 2019-03-07 SURGERY — LAPAROSCOPIC ROUX-EN-Y GASTRIC BYPASS WITH UPPER ENDOSCOPY
Anesthesia: General | Site: Abdomen

## 2019-03-07 MED ORDER — GABAPENTIN 300 MG PO CAPS
300.0000 mg | ORAL_CAPSULE | ORAL | Status: AC
  Administered 2019-03-07: 300 mg via ORAL
  Filled 2019-03-07: qty 1

## 2019-03-07 MED ORDER — DEXAMETHASONE SODIUM PHOSPHATE 4 MG/ML IJ SOLN
4.0000 mg | INTRAMUSCULAR | Status: AC
  Administered 2019-03-07: 10 mg via INTRAVENOUS

## 2019-03-07 MED ORDER — BUDESONIDE 0.25 MG/2ML IN SUSP
0.2500 mg | Freq: Two times a day (BID) | RESPIRATORY_TRACT | Status: DC
  Filled 2019-03-07 (×2): qty 2

## 2019-03-07 MED ORDER — SODIUM CHLORIDE 0.9 % IV SOLN
INTRAVENOUS | Status: DC
  Administered 2019-03-07 – 2019-03-09 (×6): via INTRAVENOUS

## 2019-03-07 MED ORDER — PROPOFOL 10 MG/ML IV BOLUS
INTRAVENOUS | Status: AC
  Filled 2019-03-07: qty 40

## 2019-03-07 MED ORDER — GABAPENTIN 100 MG PO CAPS
200.0000 mg | ORAL_CAPSULE | Freq: Two times a day (BID) | ORAL | Status: DC
  Administered 2019-03-07 – 2019-03-09 (×4): 200 mg via ORAL
  Filled 2019-03-07 (×4): qty 2

## 2019-03-07 MED ORDER — CHLORHEXIDINE GLUCONATE 4 % EX LIQD: 60.0000 mL | Freq: Once | CUTANEOUS | Status: DC

## 2019-03-07 MED ORDER — LACTATED RINGERS IV SOLN
INTRAVENOUS | Status: DC
  Administered 2019-03-07 (×2): via INTRAVENOUS

## 2019-03-07 MED ORDER — ACETAMINOPHEN 500 MG PO TABS
1000.0000 mg | ORAL_TABLET | ORAL | Status: AC
  Administered 2019-03-07: 1000 mg via ORAL
  Filled 2019-03-07: qty 2

## 2019-03-07 MED ORDER — APREPITANT 40 MG PO CAPS
40.0000 mg | ORAL_CAPSULE | ORAL | Status: AC
  Administered 2019-03-07: 40 mg via ORAL
  Filled 2019-03-07: qty 1

## 2019-03-07 MED ORDER — KETOROLAC TROMETHAMINE 30 MG/ML IJ SOLN
30.0000 mg | Freq: Once | INTRAMUSCULAR | Status: AC | PRN
  Administered 2019-03-07: 30 mg via INTRAVENOUS

## 2019-03-07 MED ORDER — ONDANSETRON HCL 4 MG/2ML IJ SOLN: 4.0000 mg | INTRAMUSCULAR | Status: DC | PRN

## 2019-03-07 MED ORDER — EVICEL 5 ML EX KIT
PACK | Freq: Once | CUTANEOUS | Status: AC
  Administered 2019-03-07: 5 mL
  Filled 2019-03-07: qty 1

## 2019-03-07 MED ORDER — LIDOCAINE 2% (20 MG/ML) 5 ML SYRINGE
INTRAMUSCULAR | Status: AC
  Filled 2019-03-07: qty 5

## 2019-03-07 MED ORDER — KETOROLAC TROMETHAMINE 30 MG/ML IJ SOLN
INTRAMUSCULAR | Status: AC
  Filled 2019-03-07: qty 1

## 2019-03-07 MED ORDER — SIMETHICONE 80 MG PO CHEW
80.0000 mg | CHEWABLE_TABLET | Freq: Four times a day (QID) | ORAL | Status: DC | PRN
  Administered 2019-03-07: 80 mg via ORAL
  Filled 2019-03-07: qty 1

## 2019-03-07 MED ORDER — BUPIVACAINE LIPOSOME 1.3 % IJ SUSP
INTRAMUSCULAR | Status: DC | PRN
  Administered 2019-03-07: 20 mL

## 2019-03-07 MED ORDER — LACTATED RINGERS IR SOLN
Status: DC | PRN
  Administered 2019-03-07: 1

## 2019-03-07 MED ORDER — ENSURE MAX PROTEIN PO LIQD
2.0000 [oz_av] | ORAL | Status: DC
  Administered 2019-03-08 – 2019-03-09 (×11): 2 [oz_av] via ORAL

## 2019-03-07 MED ORDER — BUPIVACAINE LIPOSOME 1.3 % IJ SUSP
20.0000 mL | Freq: Once | INTRAMUSCULAR | Status: DC
  Filled 2019-03-07 (×2): qty 20

## 2019-03-07 MED ORDER — ENOXAPARIN SODIUM 40 MG/0.4ML ~~LOC~~ SOLN
40.0000 mg | SUBCUTANEOUS | Status: AC
  Administered 2019-03-07: 40 mg via SUBCUTANEOUS
  Filled 2019-03-07: qty 0.4

## 2019-03-07 MED ORDER — FENTANYL CITRATE (PF) 100 MCG/2ML IJ SOLN
INTRAMUSCULAR | Status: AC
  Filled 2019-03-07: qty 2

## 2019-03-07 MED ORDER — KETAMINE HCL 10 MG/ML IJ SOLN
INTRAMUSCULAR | Status: AC
  Filled 2019-03-07: qty 1

## 2019-03-07 MED ORDER — LIDOCAINE HCL 2 % IJ SOLN
INTRAMUSCULAR | Status: AC
  Filled 2019-03-07: qty 20

## 2019-03-07 MED ORDER — MIDAZOLAM HCL 2 MG/2ML IJ SOLN
INTRAMUSCULAR | Status: AC
  Filled 2019-03-07: qty 2

## 2019-03-07 MED ORDER — ROCURONIUM BROMIDE 10 MG/ML (PF) SYRINGE
PREFILLED_SYRINGE | INTRAVENOUS | Status: DC | PRN
  Administered 2019-03-07: 60 mg via INTRAVENOUS
  Administered 2019-03-07 (×2): 20 mg via INTRAVENOUS

## 2019-03-07 MED ORDER — PHENYLEPHRINE 40 MCG/ML (10ML) SYRINGE FOR IV PUSH (FOR BLOOD PRESSURE SUPPORT)
PREFILLED_SYRINGE | INTRAVENOUS | Status: DC | PRN
  Administered 2019-03-07: 80 ug via INTRAVENOUS

## 2019-03-07 MED ORDER — HYDRALAZINE HCL 20 MG/ML IJ SOLN
10.0000 mg | Freq: Once | INTRAMUSCULAR | Status: AC
  Administered 2019-03-07 (×2): 10 mg via INTRAVENOUS

## 2019-03-07 MED ORDER — FENTANYL CITRATE (PF) 100 MCG/2ML IJ SOLN
INTRAMUSCULAR | Status: DC | PRN
  Administered 2019-03-07: 100 ug via INTRAVENOUS
  Administered 2019-03-07 (×4): 50 ug via INTRAVENOUS

## 2019-03-07 MED ORDER — HYDRALAZINE HCL 20 MG/ML IJ SOLN: 10.0000 mg | INTRAMUSCULAR | Status: DC | PRN

## 2019-03-07 MED ORDER — PROPOFOL 10 MG/ML IV BOLUS
INTRAVENOUS | Status: DC | PRN
  Administered 2019-03-07: 200 mg via INTRAVENOUS

## 2019-03-07 MED ORDER — SCOPOLAMINE 1 MG/3DAYS TD PT72
1.0000 | MEDICATED_PATCH | TRANSDERMAL | Status: DC
  Administered 2019-03-07: 1.5 mg via TRANSDERMAL
  Filled 2019-03-07: qty 1

## 2019-03-07 MED ORDER — HYDROXYZINE HCL 25 MG PO TABS: 50.0000 mg | ORAL_TABLET | Freq: Three times a day (TID) | ORAL | Status: DC | PRN

## 2019-03-07 MED ORDER — PROMETHAZINE HCL 25 MG/ML IJ SOLN: 6.2500 mg | INTRAMUSCULAR | Status: DC | PRN

## 2019-03-07 MED ORDER — ACETAMINOPHEN 160 MG/5ML PO SOLN
650.0000 mg | Freq: Four times a day (QID) | ORAL | Status: DC
  Administered 2019-03-07 – 2019-03-09 (×7): 650 mg via ORAL
  Filled 2019-03-07 (×8): qty 20.3

## 2019-03-07 MED ORDER — ROCURONIUM BROMIDE 10 MG/ML (PF) SYRINGE
PREFILLED_SYRINGE | INTRAVENOUS | Status: AC
  Filled 2019-03-07: qty 10

## 2019-03-07 MED ORDER — ONDANSETRON HCL 4 MG/2ML IJ SOLN
INTRAMUSCULAR | Status: AC
  Filled 2019-03-07: qty 2

## 2019-03-07 MED ORDER — BUPIVACAINE-EPINEPHRINE 0.25% -1:200000 IJ SOLN
INTRAMUSCULAR | Status: DC | PRN
  Administered 2019-03-07: 30 mL

## 2019-03-07 MED ORDER — PANTOPRAZOLE SODIUM 40 MG IV SOLR
40.0000 mg | Freq: Every day | INTRAVENOUS | Status: DC
  Administered 2019-03-07 – 2019-03-08 (×2): 40 mg via INTRAVENOUS
  Filled 2019-03-07 (×2): qty 40

## 2019-03-07 MED ORDER — BUPIVACAINE-EPINEPHRINE (PF) 0.25% -1:200000 IJ SOLN
INTRAMUSCULAR | Status: AC
  Filled 2019-03-07: qty 30

## 2019-03-07 MED ORDER — DEXAMETHASONE SODIUM PHOSPHATE 10 MG/ML IJ SOLN
INTRAMUSCULAR | Status: AC
  Filled 2019-03-07: qty 1

## 2019-03-07 MED ORDER — METOPROLOL TARTRATE 5 MG/5ML IV SOLN: 5.0000 mg | Freq: Four times a day (QID) | INTRAVENOUS | Status: DC | PRN

## 2019-03-07 MED ORDER — HYDRALAZINE HCL 20 MG/ML IJ SOLN
INTRAMUSCULAR | Status: AC
  Filled 2019-03-07: qty 1

## 2019-03-07 MED ORDER — PHENYLEPHRINE 40 MCG/ML (10ML) SYRINGE FOR IV PUSH (FOR BLOOD PRESSURE SUPPORT)
PREFILLED_SYRINGE | INTRAVENOUS | Status: AC
  Filled 2019-03-07: qty 10

## 2019-03-07 MED ORDER — KETAMINE HCL 10 MG/ML IJ SOLN
INTRAMUSCULAR | Status: DC | PRN
  Administered 2019-03-07: 40 mg via INTRAVENOUS

## 2019-03-07 MED ORDER — ALBUTEROL SULFATE (2.5 MG/3ML) 0.083% IN NEBU: 3.0000 mL | INHALATION_SOLUTION | RESPIRATORY_TRACT | Status: DC | PRN

## 2019-03-07 MED ORDER — SERTRALINE HCL 50 MG PO TABS
50.0000 mg | ORAL_TABLET | Freq: Every day | ORAL | Status: DC
  Administered 2019-03-07 – 2019-03-09 (×3): 50 mg via ORAL
  Filled 2019-03-07 (×3): qty 1

## 2019-03-07 MED ORDER — OXYCODONE HCL 5 MG/5ML PO SOLN
5.0000 mg | Freq: Four times a day (QID) | ORAL | Status: DC | PRN
  Administered 2019-03-07 – 2019-03-08 (×2): 5 mg via ORAL
  Filled 2019-03-07 (×2): qty 5

## 2019-03-07 MED ORDER — DOCUSATE SODIUM 100 MG PO CAPS
100.0000 mg | ORAL_CAPSULE | Freq: Two times a day (BID) | ORAL | Status: DC
  Administered 2019-03-07 – 2019-03-09 (×4): 100 mg via ORAL
  Filled 2019-03-07 (×4): qty 1

## 2019-03-07 MED ORDER — METHOCARBAMOL 1000 MG/10ML IJ SOLN
500.0000 mg | Freq: Four times a day (QID) | INTRAVENOUS | Status: DC | PRN
  Filled 2019-03-07: qty 5

## 2019-03-07 MED ORDER — HYDROMORPHONE HCL 1 MG/ML IJ SOLN: 0.5000 mg | INTRAMUSCULAR | Status: DC | PRN

## 2019-03-07 MED ORDER — LIDOCAINE 2% (20 MG/ML) 5 ML SYRINGE
INTRAMUSCULAR | Status: DC | PRN
  Administered 2019-03-07: 60 mg via INTRAVENOUS

## 2019-03-07 MED ORDER — FENTANYL CITRATE (PF) 100 MCG/2ML IJ SOLN
25.0000 ug | INTRAMUSCULAR | Status: DC | PRN
  Administered 2019-03-07: 25 ug via INTRAVENOUS
  Administered 2019-03-07: 50 ug via INTRAVENOUS
  Administered 2019-03-07: 25 ug via INTRAVENOUS

## 2019-03-07 MED ORDER — SUGAMMADEX SODIUM 500 MG/5ML IV SOLN
INTRAVENOUS | Status: AC
  Filled 2019-03-07: qty 5

## 2019-03-07 MED ORDER — SODIUM CHLORIDE 0.9 % IV SOLN
INTRAVENOUS | Status: DC | PRN
  Administered 2019-03-07: 2 g via INTRAVENOUS

## 2019-03-07 MED ORDER — SUGAMMADEX SODIUM 200 MG/2ML IV SOLN
INTRAVENOUS | Status: DC | PRN
  Administered 2019-03-07: 300 mg via INTRAVENOUS

## 2019-03-07 MED ORDER — MIDAZOLAM HCL 5 MG/5ML IJ SOLN
INTRAMUSCULAR | Status: DC | PRN
  Administered 2019-03-07: 2 mg via INTRAVENOUS

## 2019-03-07 MED ORDER — ONDANSETRON HCL 4 MG/2ML IJ SOLN
INTRAMUSCULAR | Status: DC | PRN
  Administered 2019-03-07: 4 mg via INTRAVENOUS

## 2019-03-07 MED ORDER — GLYCOPYRROLATE PF 0.2 MG/ML IJ SOSY
PREFILLED_SYRINGE | INTRAMUSCULAR | Status: AC
  Filled 2019-03-07: qty 1

## 2019-03-07 MED ORDER — ENOXAPARIN SODIUM 30 MG/0.3ML ~~LOC~~ SOLN
30.0000 mg | Freq: Two times a day (BID) | SUBCUTANEOUS | Status: DC
  Administered 2019-03-08 – 2019-03-09 (×3): 30 mg via SUBCUTANEOUS
  Filled 2019-03-07 (×3): qty 0.3

## 2019-03-07 MED ORDER — LIDOCAINE 2% (20 MG/ML) 5 ML SYRINGE
INTRAMUSCULAR | Status: DC | PRN
  Administered 2019-03-07: 1.5 mg/kg/h via INTRAVENOUS

## 2019-03-07 SURGICAL SUPPLY — 94 items
APPLICATOR COTTON TIP 6 STRL (MISCELLANEOUS) IMPLANT
APPLICATOR COTTON TIP 6IN STRL (MISCELLANEOUS)
APPLIER CLIP 5 13 M/L LIGAMAX5 (MISCELLANEOUS)
APPLIER CLIP ROT 10 11.4 M/L (STAPLE)
APPLIER CLIP ROT 13.4 12 LRG (CLIP) ×4
BAG LAPAROSCOPIC 12 15 PORT 16 (BASKET) IMPLANT
BAG RETRIEVAL 12/15 (BASKET)
BAG RETRIEVAL 12/15MM (BASKET)
BANDAGE ADH SHEER 1  50/CT (GAUZE/BANDAGES/DRESSINGS) ×24 IMPLANT
BENZOIN TINCTURE PRP APPL 2/3 (GAUZE/BANDAGES/DRESSINGS) ×4 IMPLANT
BLADE SURG SZ11 CARB STEEL (BLADE) ×4 IMPLANT
CABLE HIGH FREQUENCY MONO STRZ (ELECTRODE) ×4 IMPLANT
CHLORAPREP W/TINT 26ML (MISCELLANEOUS) ×4 IMPLANT
CLIP APPLIE 5 13 M/L LIGAMAX5 (MISCELLANEOUS) IMPLANT
CLIP APPLIE ROT 10 11.4 M/L (STAPLE) IMPLANT
CLIP APPLIE ROT 13.4 12 LRG (CLIP) ×2 IMPLANT
CLOSURE WOUND 1/2 X4 (GAUZE/BANDAGES/DRESSINGS) ×1
COVER SURGICAL LIGHT HANDLE (MISCELLANEOUS) ×4 IMPLANT
COVER WAND RF STERILE (DRAPES) ×4 IMPLANT
DECANTER SPIKE VIAL GLASS SM (MISCELLANEOUS) ×4 IMPLANT
DEVICE SUT QUICK LOAD TK 5 (STAPLE) IMPLANT
DEVICE SUT TI-KNOT TK 5X26 (MISCELLANEOUS) IMPLANT
DEVICE SUTURE ENDOST 10MM (ENDOMECHANICALS) ×4 IMPLANT
DEVICE TI KNOT TK5 (MISCELLANEOUS)
DRAIN PENROSE 18X1/4 LTX STRL (WOUND CARE) ×4 IMPLANT
DRAPE UTILITY XL STRL (DRAPES) ×8 IMPLANT
ELECT L-HOOK LAP 45CM DISP (ELECTROSURGICAL)
ELECT PENCIL ROCKER SW 15FT (MISCELLANEOUS) IMPLANT
ELECT REM PT RETURN 15FT ADLT (MISCELLANEOUS) ×4 IMPLANT
ELECTRODE L-HOOK LAP 45CM DISP (ELECTROSURGICAL) IMPLANT
GAUZE SPONGE 4X4 12PLY STRL (GAUZE/BANDAGES/DRESSINGS) IMPLANT
GLOVE BIO SURGEON STRL SZ 6 (GLOVE) ×4 IMPLANT
GLOVE BIO SURGEON STRL SZ7 (GLOVE) ×4 IMPLANT
GLOVE BIOGEL PI IND STRL 7.0 (GLOVE) ×2 IMPLANT
GLOVE BIOGEL PI INDICATOR 7.0 (GLOVE) ×2
GLOVE INDICATOR 6.5 STRL GRN (GLOVE) ×4 IMPLANT
GOWN STRL REUS W/TWL LRG LVL3 (GOWN DISPOSABLE) ×4 IMPLANT
GOWN STRL REUS W/TWL XL LVL3 (GOWN DISPOSABLE) ×16 IMPLANT
GRASPER SUT TROCAR 14GX15 (MISCELLANEOUS) IMPLANT
HOVERMATT SINGLE USE (MISCELLANEOUS) ×4 IMPLANT
IRRIG SUCT STRYKERFLOW 2 WTIP (MISCELLANEOUS)
IRRIGATION SUCT STRKRFLW 2 WTP (MISCELLANEOUS) IMPLANT
KIT BASIN OR (CUSTOM PROCEDURE TRAY) ×4 IMPLANT
KIT TURNOVER KIT A (KITS) ×4 IMPLANT
MARKER SKIN DUAL TIP RULER LAB (MISCELLANEOUS) ×4 IMPLANT
NEEDLE SPNL 22GX3.5 QUINCKE BK (NEEDLE) ×4 IMPLANT
PACK CARDIOVASCULAR III (CUSTOM PROCEDURE TRAY) ×4 IMPLANT
PACK UNIVERSAL I (CUSTOM PROCEDURE TRAY) ×4 IMPLANT
QUICK LOAD TK 5 (STAPLE)
RELOAD ENDO STITCH (ENDOMECHANICALS) IMPLANT
RELOAD ENDO STITCH 2.0 (ENDOMECHANICALS) ×22
RELOAD STAPLER BLUE 60MM (STAPLE) ×8 IMPLANT
RELOAD STAPLER GOLD 60MM (STAPLE) ×2 IMPLANT
RELOAD STAPLER GREEN 60MM (STAPLE) ×2 IMPLANT
RELOAD STAPLER WHITE 60MM (STAPLE) ×4 IMPLANT
SCISSORS LAP 5X45 EPIX DISP (ENDOMECHANICALS) ×4 IMPLANT
SET IRRIG TUBING LAPAROSCOPIC (IRRIGATION / IRRIGATOR) ×4 IMPLANT
SET TUBE SMOKE EVAC HIGH FLOW (TUBING) ×4 IMPLANT
SHEARS HARMONIC ACE PLUS 45CM (MISCELLANEOUS) ×4 IMPLANT
SLEEVE ADV FIXATION 5X100MM (TROCAR) IMPLANT
SLEEVE GASTRECTOMY 40FR VISIGI (MISCELLANEOUS) IMPLANT
SLEEVE XCEL OPT CAN 5 100 (ENDOMECHANICALS) ×12 IMPLANT
SOLUTION ANTI FOG 6CC (MISCELLANEOUS) ×4 IMPLANT
SPONGE LAP 18X18 RF (DISPOSABLE) ×4 IMPLANT
STAPLE ECHEON FLEX 60 POW ENDO (STAPLE) ×4 IMPLANT
STAPLER ECHELON BIOABSB 60 FLE (MISCELLANEOUS) IMPLANT
STAPLER ECHELON LONG 60 440 (INSTRUMENTS) IMPLANT
STAPLER RELOAD BLUE 60MM (STAPLE) ×16
STAPLER RELOAD GOLD 60MM (STAPLE) ×4
STAPLER RELOAD GREEN 60MM (STAPLE) ×4
STAPLER RELOAD WHITE 60MM (STAPLE) ×8
STRIP CLOSURE SKIN 1/2X4 (GAUZE/BANDAGES/DRESSINGS) ×3 IMPLANT
SUT MNCRL AB 4-0 PS2 18 (SUTURE) ×4 IMPLANT
SUT RELOAD ENDO STITCH 2 48X1 (ENDOMECHANICALS) ×12
SUT RELOAD ENDO STITCH 2.0 (ENDOMECHANICALS) ×10
SUT SURGIDAC NAB ES-9 0 48 120 (SUTURE) IMPLANT
SUT VICRYL 0 TIES 12 18 (SUTURE) ×4 IMPLANT
SUTURE RELOAD END STTCH 2 48X1 (ENDOMECHANICALS) ×12 IMPLANT
SUTURE RELOAD ENDO STITCH 2.0 (ENDOMECHANICALS) ×10 IMPLANT
SYR 10ML ECCENTRIC (SYRINGE) ×4 IMPLANT
SYR 20CC LL (SYRINGE) ×4 IMPLANT
SYR 50ML LL SCALE MARK (SYRINGE) ×4 IMPLANT
TIP RIGID 35CM EVICEL (HEMOSTASIS) ×4 IMPLANT
TOWEL OR 17X26 10 PK STRL BLUE (TOWEL DISPOSABLE) ×4 IMPLANT
TOWEL OR NON WOVEN STRL DISP B (DISPOSABLE) ×4 IMPLANT
TRAY FOLEY MTR SLVR 16FR STAT (SET/KITS/TRAYS/PACK) ×4 IMPLANT
TROCAR ADV FIXATION 5X100MM (TROCAR) IMPLANT
TROCAR BLADELESS 15MM (ENDOMECHANICALS) IMPLANT
TROCAR BLADELESS OPT 5 100 (ENDOMECHANICALS) ×4 IMPLANT
TROCAR XCEL 12X100 BLDLESS (ENDOMECHANICALS) ×8 IMPLANT
TUBE CALIBRATION LAPBAND (TUBING) IMPLANT
TUBING CONNECTING 10 (TUBING) ×6 IMPLANT
TUBING CONNECTING 10' (TUBING) ×2
TUBING ENDO SMARTCAP (MISCELLANEOUS) ×4 IMPLANT

## 2019-03-07 NOTE — Anesthesia Postprocedure Evaluation (Signed)
Anesthesia Post Note  Patient: Ashley Paul  Procedure(s) Performed: LAPAROSCOPIC ROUX-EN-Y GASTRIC BYPASS WITH UPPER ENDOSCOPY (Abdomen)     Patient location during evaluation: PACU Anesthesia Type: General Level of consciousness: awake and alert Pain management: pain level controlled Vital Signs Assessment: post-procedure vital signs reviewed and stable Respiratory status: spontaneous breathing, nonlabored ventilation, respiratory function stable and patient connected to nasal cannula oxygen Cardiovascular status: blood pressure returned to baseline and stable Postop Assessment: no apparent nausea or vomiting Anesthetic complications: no    Last Vitals:  Vitals:   03/07/19 1100 03/07/19 1115  BP: (!) 145/100 (!) 151/98  Pulse: 100 82  Resp: (!) 6 10  Temp:    SpO2: 96% 99%    Last Pain:  Vitals:   03/07/19 1100  TempSrc:   PainSc: 8                  Hilda Rynders S

## 2019-03-07 NOTE — Op Note (Signed)
Name:  [name] MRN: [id number] Date of Surgery: 03/07/2019  Preop Diagnosis:  Morbid Obesity, S/P RYGB  Postop Diagnosis:  Morbid Obesity, S/P RYGB (Weight - 255, BMI - 38.8)  Procedure:  Upper endoscopy  (Intraoperative)  Surgeon:  Alphonsa Overall, M.D.  Anesthesia:  GET  Indications for procedure: Ashley Paul is a 48 y.o. female whose primary care physician is Sharyne Peach, MD and has completed a Roux-en-Y gastric bypass today by Dr. Kae Heller.  I am doing an intraoperative upper endoscopy to evaluate the gastric pouch and the gastro-jejunal anastomosis.  Operative Note: The patient is under general anesthesia.  Dr. Kae Heller is laparoscoping the patient while I do an upper endoscopy to evaluate the stomach pouch and gastrojejunal anastomosis.  With the patient intubated, I passed the Olympus endoscope without difficulty down the esophagus.  The esophago-gastric junction was at 40 cm.  The gastro-jejunal anastomosis was at 45 cm.  The mucosa of the stomach looked viable and the staple line was intact without bleeding.  The gastro-jejunal anastomosis looked okay.  While I insufflated the stomach pouch with air, Dr. Kae Heller clamped off the efferent limb of the jejunum.  He then flooded the upper abdomen with saline to put the gastric pouch and gastro-jejunal anastomosis under saline.  There was no bubbling or evidence of a leak.    The scope was then withdrawn.  The esophagus was unremarkable and the patient tolerated the endoscopy without difficulty.  Alphonsa Overall, MD, [facility] Surgery Pager: [phone] Office phone:  [phone]

## 2019-03-07 NOTE — Anesthesia Procedure Notes (Signed)
Procedure Name: Intubation Date/Time: 03/07/2019 7:27 AM Performed by: Claudia Desanctis, CRNA Pre-anesthesia Checklist: Patient identified, Emergency Drugs available, Suction available and Patient being monitored Patient Re-evaluated:Patient Re-evaluated prior to induction Oxygen Delivery Method: Circle system utilized Preoxygenation: Pre-oxygenation with 100% oxygen Induction Type: IV induction Ventilation: Mask ventilation without difficulty Laryngoscope Size: 2 and Miller Grade View: Grade I Tube type: Oral Tube size: 7.0 mm Number of attempts: 1 Airway Equipment and Method: Stylet Placement Confirmation: ETT inserted through vocal cords under direct vision,  positive ETCO2 and breath sounds checked- equal and bilateral Secured at: 22 cm Tube secured with: Tape Dental Injury: Teeth and Oropharynx as per pre-operative assessment

## 2019-03-07 NOTE — Interval H&P Note (Signed)
History and Physical Interval Note:  03/07/2019 6:58 AM  Ashley Paul  has presented today for surgery, with the diagnosis of Morbid Obesity, Pre-Diabetes, HTN, Osteoarthritis, Hypercholesterolemia.  The various methods of treatment have been discussed with the patient and family. After consideration of risks, benefits and other options for treatment, the patient has consented to  Roux en y gastric bypass as a surgical intervention.  The patient's history has been reviewed, patient examined, no change in status, stable for surgery.  I have reviewed the patient's chart and labs.  Questions were answered to the patient's satisfaction.     Korrina Zern Rich Brave

## 2019-03-07 NOTE — Progress Notes (Signed)
PHARMACY CONSULT FOR:  Risk Assessment for Post-Discharge VTE Following Bariatric Surgery  Post-Discharge VTE Risk Assessment: This patient's probability of 30-day post-discharge VTE is increased due to the factors marked:   Female    Age >/=60 years    BMI >/=50 kg/m2    CHF    Dyspnea at Rest    Paraplegia  x  Non-gastric-band surgery    Operation Time >/=3 hr    Return to OR     Length of Stay >/= 3 d   Predicted probability of 30-day post-discharge VTE: 0.16%   Other patient-specific factors to consider: n/a  Recommendation for Discharge: No pharmacologic prophylaxis post-discharge  Ashley Paul is a 48 y.o. female who underwent  laparoscopic Roux-en-Y gastric bypass on 03/07/19  Case start: 0750 Case end: 1020   Allergies  Allergen Reactions  . Azathioprine Other (See Comments)    Flu-like symptoms, tachycardia  . Bupropion Hives  . Lisinopril Cough  . Mycophenolate Mofetil Hives  . Sulfa Antibiotics Swelling    SWELLING REACTION UNSPECIFIED   . Pantoprazole Sodium Palpitations  . Singulair [Montelukast] Anxiety    anxiety  . Tramadol Anxiety    Severe anxiety    Patient Measurements: Height: 5\' 8"  (172.7 cm) Weight: 254 lb (115.2 kg) IBW/kg (Calculated) : 63.9 Body mass index is 38.62 kg/m.  Recent Labs    03/06/19 0848  WBC 6.5  HGB 13.7  HCT 44.3  PLT 297  CREATININE 0.73  ALBUMIN 4.7  PROT 7.9  AST 77*  ALT 102*  ALKPHOS 57  BILITOT 0.6   Estimated Creatinine Clearance: 113.3 mL/min (by C-G formula based on SCr of 0.73 mg/dL).    Past Medical History:  Diagnosis Date  . Allergy   . Anxiety   . Arthritis    neck, lower back  . Depression   . GERD (gastroesophageal reflux disease)   . Goiter   . Hypertension   . Obesity      Medications Prior to Admission  Medication Sig Dispense Refill Last Dose  . acetaminophen (TYLENOL) 325 MG tablet Take 650 mg by mouth every 6 (six) hours as needed for headache (pain).   03/06/2019 at 1700   . beclomethasone (QVAR REDIHALER) 80 MCG/ACT inhaler Inhale 1 puff into the lungs 2 (two) times daily. 1 Inhaler 0 03/06/2019 at Unknown time  . hydrOXYzine (ATARAX/VISTARIL) 50 MG tablet Take 50 mg by mouth every 8 (eight) hours as needed for anxiety.   8 Past Week at Unknown time  . losartan (COZAAR) 100 MG tablet Take 100 mg by mouth daily.   03/06/2019 at Unknown time  . omeprazole (PRILOSEC) 20 MG capsule Take 1 capsule (20 mg total) by mouth daily before breakfast. 60 capsule 3 03/06/2019 at Unknown time  . sertraline (ZOLOFT) 50 MG tablet Take 50 mg by mouth daily.    03/06/2019 at Unknown time  . Vitamin D, Ergocalciferol, (DRISDOL) 1.25 MG (50000 UT) CAPS capsule Take 50,000 Units by mouth 2 (two) times a week.    Past Week at Unknown time  . albuterol (PROVENTIL HFA;VENTOLIN HFA) 108 (90 Base) MCG/ACT inhaler Inhale 2 puffs into the lungs every 4 (four) hours as needed for wheezing or shortness of breath. 1 Inhaler 0 More than a month at Unknown time    Lenis Noon, PharmD 03/07/2019,12:11 PM

## 2019-03-07 NOTE — Discharge Instructions (Signed)
° ° ° °GASTRIC BYPASS/SLEEVE ° Home Care Instructions ° ° These instructions are to help you care for yourself when you go home. ° °Call: If you have any problems. °• Call [phone] and ask for the surgeon on call °• If you need immediate help, come to the ER at [facility].  °• Tell the ER staff that you are a new post-op gastric bypass or gastric sleeve patient °  °Signs and symptoms to report: • Severe vomiting or nausea °o If you cannot keep down clear liquids for longer than 1 day, call your surgeon  °• Abdominal pain that does not get better after taking your pain medication °• Fever over 100.4° F with chills °• Heart beating over 100 beats a minute °• Shortness of breath at rest °• Chest pain °•  Redness, swelling, drainage, or foul odor at incision (surgical) sites °•  If your incisions open or pull apart °• Swelling or pain in calf (lower leg) °• Diarrhea (Loose bowel movements that happen often), frequent watery, uncontrolled bowel movements °• Constipation, (no bowel movements for 3 days) if this happens: Pick one °o Milk of Magnesia, 2 tablespoons by mouth, 3 times a day for 2 days if needed °o Stop taking Milk of Magnesia once you have a bowel movement °o Call your doctor if constipation continues °Or °o Miralax  (instead of Milk of Magnesia) following the label instructions °o Stop taking Miralax once you have a bowel movement °o Call your doctor if constipation continues °• Anything you think is not normal °  °Normal side effects after surgery: • Unable to sleep at night or unable to focus °• Irritability or moody °• Being tearful (crying) or depressed °These are common complaints, possibly related to your anesthesia medications that put you to sleep, stress of surgery, and change in lifestyle.  This usually goes away a few weeks after surgery.  If these feelings continue, call your primary care doctor. °  °Wound Care: You may have surgical glue, steri-strips, or staples over your incisions after  surgery °• Surgical glue:  Looks like a clear film over your incisions and will wear off a little at a time °• Steri-strips: Strips of tape over your incisions. You may notice a yellowish color on the skin under the steri-strips. This is used to make the   steri-strips stick better. Do not pull the steri-strips off - let them fall off °• Staples: Staples may be removed before you leave the hospital °o If you go home with staples, call [facility], [phone] at for an appointment with your surgeon’s nurse to have staples removed 10 days after surgery. °• Showering: You may shower two (2) days after your surgery unless your surgeon tells you differently °o Wash gently around incisions with warm soapy water, rinse well, and gently pat dry  °o No tub baths until staples are removed, steri-strips fall off or glue is gone.  °  °Medications: • Medications should be liquid or crushed if larger than the size of a dime °• Extended release pills (medication that release a little bit at a time through the day) should NOT be crushed or cut. (examples include XL, ER, DR, SR) °• Depending on the size and number of medications you take, you may need to space (take a few throughout the day)/change the time you take your medications so that you do not over-fill your pouch (smaller stomach) °• Make sure you follow-up with your primary care doctor to   make medication changes needed during rapid weight loss and life-style changes °• If you have diabetes, follow up with the doctor that orders your diabetes medication(s) within one week after surgery and check your blood sugar regularly. °• Do not drive while taking prescription pain medication  °• It is ok to take Tylenol by the bottle instructions with your pain medicine or instead of your pain medicine as needed.  DO NOT TAKE NSAIDS (EXAMPLES OF NSAIDS:  IBUPROFREN/ NAPROXEN)  °Diet:                    First 2 Weeks ° You will see the dietician t about two (2) weeks  after your surgery. The dietician will increase the types of foods you can eat if you are handling liquids well: °• If you have severe vomiting or nausea and cannot keep down clear liquids lasting longer than 1 day, call your surgeon @ ([phone]) °Protein Shake °• Drink at least 2 ounces of shake 5-6 times per day °• Each serving of protein shakes (usually 8 - 12 ounces) should have: °o 15 grams of protein  °o And no more than 5 grams of carbohydrate  °• Goal for protein each day: °o Men = 80 grams per day °o Women = 60 grams per day °• Protein powder may be added to fluids such as non-fat milk or Lactaid milk or unsweetened Soy/Almond milk (limit to 35 grams added protein powder per serving) ° °Hydration °• Slowly increase the amount of water and other clear liquids as tolerated (See Acceptable Fluids) °• Slowly increase the amount of protein shake as tolerated  °•  Sip fluids slowly and throughout the day.  Do not use straws. °• May use sugar substitutes in small amounts (no more than 6 - 8 packets per day; i.e. Splenda) ° °Fluid Goal °• The first goal is to drink at least 8 ounces of protein shake/drink per day (or as directed by the nutritionist); some examples of protein shakes are Syntrax Nectar, Adkins Advantage, EAS Edge HP, and Unjury. See handout from pre-op Bariatric Education Class: °o Slowly increase the amount of protein shake you drink as tolerated °o You may find it easier to slowly sip shakes throughout the day °o It is important to get your proteins in first °• Your fluid goal is to drink 64 - 100 ounces of fluid daily °o It may take a few weeks to build up to this °• 32 oz (or more) should be clear liquids  °And  °• 32 oz (or more) should be full liquids (see below for examples) °• Liquids should not contain sugar, caffeine, or carbonation ° °Clear Liquids: °• Water or Sugar-free flavored water (i.e. Fruit H2O, Propel) °• Decaffeinated coffee or tea (sugar-free) °• Crystal Lite, Wyler’s Lite,  Minute Maid Lite °• Sugar-free Jell-O °• Bouillon or broth °• Sugar-free Popsicle:   *Less than 20 calories each; Limit 1 per day ° °Full Liquids: °Protein Shakes/Drinks + 2 choices per day of other full liquids °• Full liquids must be: °o No More Than 15 grams of Carbs per serving  °o No More Than 3 grams of Fat per serving °• Strained low-fat cream soup (except Cream of Potato or Tomato) °• Non-Fat milk °• Fat-free Lactaid Milk °• Unsweetened Soy Or Unsweetened Almond Milk °• Low Sugar yogurt (Dannon Lite & Fit, Greek yogurt; Oikos Triple Zero; Chobani Simply 100; Yoplait 100 calorie Greek - No Fruit on the Bottom) ° °  °Vitamins   and Minerals • Start 1 day after surgery unless otherwise directed by your surgeon °• 2 Chewable Bariatric Specific Multivitamin / Multimineral Supplement with iron (Example: Bariatric Advantage Multi EA) °• Chewable Calcium with Vitamin D-3 °(Example: 3 Chewable Calcium Plus 600 with Vitamin D-3) °o Take 500 mg three (3) times a day for a total of 1500 mg each day °o Do not take all 3 doses of calcium at one time as it may cause constipation, and you can only absorb 500 mg  at a time  °o Do not mix multivitamins containing iron with calcium supplements; take 2 hours apart °• Menstruating women and those with a history of anemia (a blood disease that causes weakness) may need extra iron °o Talk with your doctor to see if you need more iron °• Do not stop taking or change any vitamins or minerals until you talk to your dietitian or surgeon °• Your Dietitian and/or surgeon must approve all vitamin and mineral supplements °  °Activity and Exercise: Limit your physical activity as instructed by your doctor.  It is important to continue walking at home.  During this time, use these guidelines: °• Do not lift anything greater than ten (10) pounds for at least two (2) weeks °• Do not go back to work or drive until your surgeon says you can °• You may have sex when you feel comfortable  °o It is  VERY important for female patients to use a reliable birth control method; fertility often increases after surgery  °o All hormonal birth control will be ineffective for 30 days after surgery due to medications given during surgery a barrier method must be used. °o Do not get pregnant for at least 18 months °• Start exercising as soon as your doctor tells you that you can °o Make sure your doctor approves any physical activity °• Start with a simple walking program °• Walk 5-15 minutes each day, 7 days per week.  °• Slowly increase until you are walking 30-45 minutes per day °Consider joining our BELT program. [phone] or email [email or web address] °  °Special Instructions Things to remember: °• Use your CPAP when sleeping if this applies to you ° °• [facility] has two free Bariatric Surgery Support Groups that meet monthly °o The 3rd Thursday of each month, 6 pm, [facility]  °o The 2nd Friday of each month, 11:45 am in the private dining room in the basement of [facility] °• It is very important to keep all follow up appointments with your surgeon, dietitian, primary care physician, and behavioral health practitioner °• Routine follow up schedule with your surgeon include appointments at 2-3 weeks, 6-8 weeks, 6 months, and 1 year at a minimum.  Your surgeon may request to see you more often.   °o After the first year, please follow up with your bariatric surgeon and dietitian at least once a year in order to maintain best weight loss results °[facility]: [phone] °[facility]: [phone] °Bariatric Nurse Coordinator: [phone] °  °   Reviewed and Endorsed  °by [facility] Patient Education Committee, June, 2016 °Edits Approved: Aug, 2018 ° ° ° °

## 2019-03-07 NOTE — Op Note (Addendum)
Operative Note  Ashley Paul  [id number]  [id number]  03/07/2019   Surgeon: Clovis Riley MD   Assistant: Alphonsa Overall MD   Procedure performed: laparoscopic Roux-en-Y gastric bypass ( antecolic, antegastric) , upper endoscopy   Preop diagnosis: Morbid obesity Body mass index is 38.62 kg/m., GERD, arthritis, prediabetes, anxiety, depression, hypertension Post-op diagnosis/intraop findings: same, fatty liver   Specimens: none Retained items: none  EBL: 50 cc Complications: none   Description of procedure: After obtaining informed consent and administration of prophylactic lovenox in holding, the patient was taken to the operating room and placed supine on operating room table where general endotracheal anesthesia was initiated, preoperative antibiotics were administered, SCDs applied, and a formal timeout was performed. The abdomen was prepped and draped in usual sterile fashion. Peritoneal access was gained using a Visiport technique in the left upper quadrant and insufflation to 15 mmHg ensued without issue. Gross inspection revealed no evidence of injury. Under direct visualization the remaining trochars were inserted. A laparoscopic assisted bilateral taps block was performed using Exparel mixed with Marcaine.   The omentum was reflected cephalad and the ligament of Treitz identified. The small bowel was followed to a point 40 cm distal to ligament of Treitz at which location the bowel was divided with a white load linear cutting stapler. A Penrose was sutured to the Roux side of the staple line for future identification. The bowel was measured another 100 cm distal to this and and the site for the jejunojejunostomy was aligned with the end of the biliopancreatic limb. Enterotomies were made with the Harmonic scalpel and the anastomosis was created with the 60 mm white load linear cutting stapler. The common enterotomy was closed with running 3-0 Vicryl starting on either end and tying  centrally. The mesenteric defect was closed with running silk suture secured with Lapra-Ty's. The anastomosis was inspected and appeared widely patent, hemostatic with no gaps in the suture line. Evicel was injected over the anastomosis.  We then divided the omentum using the harmonic scalpel.   The patient was then placed in steep reverse Trendelenburg. The liver retractor was inserted through a subxiphoid incision and secured for fixed retraction of the left lobe. The Harmonic scalpel was used to enter the perigastric plane and the lesser sac at a point 5 cm distal to the GE junction on the lesser curve. The angle of His was gently bluntly dissected in the target shape of the pouch visualized to exclude any residual fundus. After confirming that all tubes have been removed from the stomach, the gastric pouch was created with serial fires of the linear cutting stapler. As we approached the angle of His, the Ewald tube was inserted to confirm no impingement on the GE junction. Bleeding on the excluded stomach staple line was controlled with clips.  The Roux limb with its attached Penrose drain was then identified and brought up to meet the gastric pouch ensuring no twist in the small bowel mesentery. The staple line of the small bowel is directed to the patient's left side. A running 3-0 Vicryl was used to create a posterior suture line for our anastomosis between the gastric pouch and the small bowel. Gastrotomy and enterotomy was made with the Harmonic scalpel and a blue load linear cutting stapler was used to create a gastrojejunal anastomosis approximately 2.5cm wide. The common enterotomy was closed with running 3-0 Vicryl starting at either end and tying centrally. At this juncture the Ewald tube was passed through the gastrojejunal  anastomosis. An anterior layer of running 3-0 Vicryl was used to complete the gastrojejunal anastomosis. The ewald tube was removed without difficulty. The [place] space was  closed with a figure-of-eight silk suture.   At this point the assistant performing an upper endoscopy with the Roux limb gently clamped with a bowel clamp. Irrigation is instilled in the upper abdomen for a leak test. Please see a separate operative note- with the lumen distended with air, the anastomosis is noted to be patent and hemostatic without any leak or bubbles present. The endoscope was removed and the abdomen once again surveyed. The remaining evicel was ejected over the gastrojejunal anastomosis. The liver retractor was removed under direct visualization. The abdomen was then desufflated and all remaining trochars removed. The skin incisions were closed with running subcuticular Monocryl; benzoin, Steri-Strips and Band-Aids were applied The patient was then awakened, extubated and taken to PACU in stable condition.     All counts were correct at the completion of the case.

## 2019-03-07 NOTE — Transfer of Care (Signed)
Immediate Anesthesia Transfer of Care Note  Patient: Ashley Paul  Procedure(s) Performed: LAPAROSCOPIC ROUX-EN-Y GASTRIC BYPASS WITH UPPER ENDOSCOPY (Abdomen)  Patient Location: PACU  Anesthesia Type:General  Level of Consciousness: awake, alert , oriented and patient cooperative  Airway & Oxygen Therapy: Patient Spontanous Breathing and Patient connected to face mask  Post-op Assessment: Report given to RN and Post -op Vital signs reviewed and stable  Post vital signs: Reviewed and stable  Last Vitals:  Vitals Value Taken Time  BP 159/99 03/07/2019 10:27 AM  Temp    Pulse 98 03/07/2019 10:28 AM  Resp    SpO2 100 % 03/07/2019 10:28 AM  Vitals shown include unvalidated device data.  Last Pain:  Vitals:   03/07/19 0537  TempSrc: Oral  PainSc:       Patients Stated Pain Goal: 3 (28/63/81 [id number])  Complications: No apparent anesthesia complications

## 2019-03-07 NOTE — Anesthesia Preprocedure Evaluation (Signed)
Anesthesia Evaluation  Patient identified by MRN, date of birth, ID band Patient awake    Reviewed: Allergy & Precautions, NPO status , Patient's Chart, lab work & pertinent test results  Airway Mallampati: II  TM Distance: >3 FB Neck ROM: Full    Dental no notable dental hx.    Pulmonary neg pulmonary ROS, former smoker,    Pulmonary exam normal breath sounds clear to auscultation       Cardiovascular hypertension, Normal cardiovascular exam Rhythm:Regular Rate:Normal     Neuro/Psych negative neurological ROS  negative psych ROS   GI/Hepatic Neg liver ROS, GERD  ,  Endo/Other  Morbid obesity  Renal/GU negative Renal ROS  negative genitourinary   Musculoskeletal negative musculoskeletal ROS (+)   Abdominal   Peds negative pediatric ROS (+)  Hematology negative hematology ROS (+)   Anesthesia Other Findings   Reproductive/Obstetrics negative OB ROS                             Anesthesia Physical Anesthesia Plan  ASA: III  Anesthesia Plan: General   Post-op Pain Management:    Induction: Intravenous  PONV Risk Score and Plan: 4 or greater and Scopolamine patch - Pre-op, Midazolam, Dexamethasone, Ondansetron and Treatment may vary due to age or medical condition  Airway Management Planned: Oral ETT  Additional Equipment:   Intra-op Plan:   Post-operative Plan: Extubation in OR  Informed Consent: I have reviewed the patients History and Physical, chart, labs and discussed the procedure including the risks, benefits and alternatives for the proposed anesthesia with the patient or authorized representative who has indicated his/her understanding and acceptance.     Dental advisory given  Plan Discussed with: CRNA and Surgeon  Anesthesia Plan Comments:         Anesthesia Quick Evaluation

## 2019-03-07 NOTE — Progress Notes (Signed)
Discussed post op day goals with patient including ambulation, IS, diet progression, pain, and nausea control.  Questions answered. 

## 2019-03-08 ENCOUNTER — Encounter (HOSPITAL_COMMUNITY): Payer: Self-pay | Admitting: Surgery

## 2019-03-08 LAB — COMPREHENSIVE METABOLIC PANEL
ALT: 121 U/L — ABNORMAL HIGH (ref 0–44)
AST: 103 U/L — ABNORMAL HIGH (ref 15–41)
Albumin: 3.7 g/dL (ref 3.5–5.0)
Alkaline Phosphatase: 45 U/L (ref 38–126)
Anion gap: 11 (ref 5–15)
BUN: 12 mg/dL (ref 6–20)
CALCIUM: 8.7 mg/dL — AB (ref 8.9–10.3)
CO2: 22 mmol/L (ref 22–32)
CREATININE: 0.72 mg/dL (ref 0.44–1.00)
Chloride: 107 mmol/L (ref 98–111)
GFR calc Af Amer: >60 mL/min (ref >60)
GFR calc non Af Amer: >60 mL/min (ref >60)
Glucose, Bld: 133 mg/dL — ABNORMAL HIGH (ref 70–99)
Potassium: 4.2 mmol/L (ref 3.5–5.1)
Sodium: 140 mmol/L (ref 135–145)
Total Bilirubin: 0.9 mg/dL (ref 0.3–1.2)
Total Protein: 6.9 g/dL (ref 6.5–8.1)

## 2019-03-08 LAB — CBC WITH DIFFERENTIAL/PLATELET
Abs Immature Granulocytes: 0.03 10*3/uL (ref 0.00–0.07)
BASOS PCT: 0 %
Basophils Absolute: 0 10*3/uL (ref 0.0–0.1)
EOS ABS: 0.2 10*3/uL (ref 0.0–0.5)
EOS PCT: 2 %
HCT: 36 % (ref 36.0–46.0)
Hemoglobin: 11.2 g/dL — ABNORMAL LOW (ref 12.0–15.0)
Immature Granulocytes: 0 %
Lymphocytes Relative: 14 %
Lymphs Abs: 1.3 10*3/uL (ref 0.7–4.0)
MCH: 27.3 pg (ref 26.0–34.0)
MCHC: 31.1 g/dL (ref 30.0–36.0)
MCV: 87.6 fL (ref 80.0–100.0)
Monocytes Absolute: 0.7 10*3/uL (ref 0.1–1.0)
Monocytes Relative: 8 %
Neutro Abs: 7.3 10*3/uL (ref 1.7–7.7)
Neutrophils Relative %: 76 %
PLATELETS: 253 10*3/uL (ref 150–400)
RBC: 4.11 MIL/uL (ref 3.87–5.11)
RDW: 13.7 % (ref 11.5–15.5)
WBC: 9.7 10*3/uL (ref 4.0–10.5)
nRBC: 0 % (ref 0.0–0.2)

## 2019-03-08 MED ORDER — LOSARTAN POTASSIUM 50 MG PO TABS
50.0000 mg | ORAL_TABLET | Freq: Every day | ORAL | Status: DC
  Administered 2019-03-08 – 2019-03-09 (×2): 50 mg via ORAL
  Filled 2019-03-08 (×2): qty 1

## 2019-03-08 NOTE — Progress Notes (Signed)
Patient alert and oriented, Post op day 1.  Provided support and encouragement.  Encouraged pulmonary toilet, ambulation and small sips of liquids.  Completed 12 ounces of bari clear fluid.  6 ounces of protein shake. All questions answered.  Will continue to monitor.

## 2019-03-08 NOTE — Progress Notes (Signed)
S: Did well overnight. Tolerating liquids/ protein shake without nausea, pain or dysphagia. Had pretty significant gas pain in the mid-chest yesterday afternoon which is now much improved. States her abdomen just feels sore this morning. Walking and using IS.  Meds- sch tylenol/ gabapentin. Has received 1 dose of prn oxycodone last night, no other prns required.   Vitals, labs, intake/output, and orders reviewed at this time. Tmax 99.8 (12:30pm yesterday), Tcurrent 98.6. HR 76-115- she became tachycardic right after receiving hydralazine yesterday, HR 101 this am. Had been hypertensive since leaving OR yesterday morning and all afternoon, normotensive this morning. Sat 97% on room air.  CMP with mild AST/ALT elevation not markedly different than preop, otherwise unremarkable. WBC 9.7 (6.5 preop), HGB 11.2 (13.7 preop), PLT 253 (297 preop) PO 540. UOP 1400.   Gen: A&Ox3, no distress, sitting up in chair H&N: EOMI, atraumatic, neck supple Chest: unlabored respirations, RRR Abd: soft, nontender, nondistended, incisions c/d/i, looks like there was some oozing from a couple which has stopped.  Ext: warm, no edema Neuro: grossly normal  Lines/tubes/drains: PIV  A/P:  POD 1 roux en y gastric bypass, doing well.  -Continue clear liquids and protein shakes -Continue aggressive pulmonary toilet -Continue walking/ mobilizing as much as possible. Prophylactic lovenox started this morning.  -Recheck CBC in AM   Romana Juniper, MD [facility], [place] Pager [phone]

## 2019-03-08 NOTE — Plan of Care (Signed)

## 2019-03-09 LAB — CBC WITH DIFFERENTIAL/PLATELET
Abs Immature Granulocytes: 0.04 10*3/uL (ref 0.00–0.07)
Basophils Absolute: 0.1 10*3/uL (ref 0.0–0.1)
Basophils Relative: 1 %
Eosinophils Absolute: 0.1 10*3/uL (ref 0.0–0.5)
Eosinophils Relative: 1 %
HCT: 35 % — ABNORMAL LOW (ref 36.0–46.0)
HEMOGLOBIN: 10.7 g/dL — AB (ref 12.0–15.0)
Immature Granulocytes: 1 %
LYMPHS PCT: 29 %
Lymphs Abs: 2.5 10*3/uL (ref 0.7–4.0)
MCH: 27.4 pg (ref 26.0–34.0)
MCHC: 30.6 g/dL (ref 30.0–36.0)
MCV: 89.5 fL (ref 80.0–100.0)
Monocytes Absolute: 0.8 10*3/uL (ref 0.1–1.0)
Monocytes Relative: 9 %
Neutro Abs: 5.3 10*3/uL (ref 1.7–7.7)
Neutrophils Relative %: 59 %
Platelets: 216 10*3/uL (ref 150–400)
RBC: 3.91 MIL/uL (ref 3.87–5.11)
RDW: 14.1 % (ref 11.5–15.5)
WBC: 8.8 10*3/uL (ref 4.0–10.5)
nRBC: 0 % (ref 0.0–0.2)

## 2019-03-09 MED ORDER — GABAPENTIN 100 MG PO CAPS: 200.0000 mg | ORAL_CAPSULE | Freq: Two times a day (BID) | ORAL | 0 refills | Status: DC

## 2019-03-09 MED ORDER — LOSARTAN POTASSIUM 100 MG PO TABS: 50.0000 mg | ORAL_TABLET | Freq: Every day | ORAL | 0 refills | Status: DC

## 2019-03-09 MED ORDER — DOCUSATE SODIUM 100 MG PO CAPS: 100.0000 mg | ORAL_CAPSULE | Freq: Two times a day (BID) | ORAL | 0 refills | Status: AC

## 2019-03-09 MED ORDER — ONDANSETRON 4 MG PO TBDP: 4.0000 mg | ORAL_TABLET | Freq: Three times a day (TID) | ORAL | 0 refills | Status: DC | PRN

## 2019-03-09 MED ORDER — ESOMEPRAZOLE MAGNESIUM 20 MG PO PACK: 20.0000 mg | PACK | Freq: Every day | ORAL | 12 refills | Status: DC

## 2019-03-09 MED ORDER — OXYCODONE HCL 5 MG/5ML PO SOLN: 5.0000 mg | Freq: Four times a day (QID) | ORAL | 0 refills | Status: DC | PRN

## 2019-03-09 NOTE — Discharge Summary (Signed)
Physician Discharge Summary  Ashley Paul KDT:[id number] DOB: [birth year] DOA: 03/07/2019  PCP: Sharyne Peach, MD  Admit date: 03/07/2019 Discharge date:  03/09/2019   Recommendations for Outpatient Follow-up:    Follow-up Information    Clovis Riley, MD. Go on 04/06/2019.   Specialty:  General Surgery Why:  at 920 am.  Please arrive 15 minutes early for your appointment.  Thank you. Contact information: [address] [place] [address] [phone]        Clovis Riley, MD .   Specialty:  General Surgery Contact information: [address] [place] [address] [phone]          Discharge Diagnoses:  Active Problems:   Morbid obesity ([place])   Surgical Procedure: Laparoscopic Roux-en-Y gastric bypass, upper endoscopy  Discharge Condition: Good Disposition: Home  Diet recommendation: Postoperative gastric bypass diet  Filed Weights   03/07/19 0536 03/07/19 0537  Weight: 115.4 kg 115.2 kg     Hospital Course:  The patient was admitted for a planned laparoscopic Roux-en-Y gastric bypass. Please see operative note. Preoperatively the patient was given lovenox for DVT prophylaxis. ERAS protocol was used. Postoperative prophylactic Lovenox dosing was started on the morning of postoperative day 1.  The patient was started on ice chips and water on the evening of POD 0 which they tolerated. On postoperative day 1 The patient's diet was advanced to protein shakes which they also tolerated. On POD 2, The patient was ambulating without difficulty. Their vital signs are stable without fever or tachycardia. Their hemoglobin had remained stable. The patient had received discharge instructions and counseling. They were deemed stable for discharge.  BP (!) 133/94 (BP Location: Right Arm)   Pulse 91   Temp 98.9 F (37.2 C) (Oral)   Resp 16   Ht 5\' 8"  (1.727 m)   Wt 115.2 kg   LMP 02/14/2019 (Exact Date) Comment: negative  urine preg.03/07/19  SpO2 96%   BMI 38.62 kg/m   Gen: alert, NAD, non-toxic appearing Pupils: equal, no scleral icterus Pulm: Lungs clear to auscultation, symmetric chest rise CV: regular rate and rhythm Abd: soft, min tender, nondistended. No cellulitis. No incisional hernia. Bruising at right 47mm trocar site.  Ext: no edema, no calf tenderness Skin: no rash, no jaundice  Discharge Instructions  Discharge Instructions    Ambulate hourly while awake   Complete by:  As directed    Call MD for:  difficulty breathing, headache or visual disturbances   Complete by:  As directed    Call MD for:  persistant dizziness or light-headedness   Complete by:  As directed    Call MD for:  persistant nausea and vomiting   Complete by:  As directed    Call MD for:  redness, tenderness, or signs of infection (pain, swelling, redness, odor or green/yellow discharge around incision site)   Complete by:  As directed    Call MD for:  severe uncontrolled pain   Complete by:  As directed    Call MD for:  temperature >101 F   Complete by:  As directed    Incentive spirometry   Complete by:  As directed    Perform hourly while awake     Allergies as of 03/09/2019      Reactions   Azathioprine Other (See Comments)   Flu-like symptoms, tachycardia   Bupropion Hives   Lisinopril Cough   Mycophenolate Mofetil Hives   Sulfa Antibiotics Swelling  SWELLING REACTION UNSPECIFIED    Pantoprazole Sodium Palpitations   Singulair [montelukast] Anxiety   anxiety   Tramadol Anxiety   Severe anxiety      Medication List    STOP taking these medications   omeprazole 20 MG capsule Commonly known as:  PRILOSEC     TAKE these medications   acetaminophen 325 MG tablet Commonly known as:  TYLENOL Take 650 mg by mouth every 6 (six) hours as needed for headache (pain).   albuterol 108 (90 Base) MCG/ACT inhaler Commonly known as:  PROVENTIL HFA;VENTOLIN HFA Inhale 2 puffs into the lungs every 4 (four)  hours as needed for wheezing or shortness of breath.   beclomethasone 80 MCG/ACT inhaler Commonly known as:  Qvar RediHaler Inhale 1 puff into the lungs 2 (two) times daily.   docusate sodium 100 MG capsule Commonly known as:  Colace Take 1 capsule (100 mg total) by mouth 2 (two) times daily for 30 days.   esomeprazole 20 MG packet Commonly known as:  NexIUM Take 20 mg by mouth daily before breakfast.   gabapentin 100 MG capsule Commonly known as:  NEURONTIN Take 2 capsules (200 mg total) by mouth every 12 (twelve) hours for 7 days.   hydrOXYzine 50 MG tablet Commonly known as:  ATARAX/VISTARIL Take 50 mg by mouth every 8 (eight) hours as needed for anxiety.   losartan 100 MG tablet Commonly known as:  COZAAR Take 0.5 tablets (50 mg total) by mouth daily. What changed:  how much to take   ondansetron 4 MG disintegrating tablet Commonly known as:  Zofran ODT Take 1 tablet (4 mg total) by mouth every 8 (eight) hours as needed for nausea or vomiting.   oxyCODONE 5 MG/5ML solution Commonly known as:  ROXICODONE Take 5 mLs (5 mg total) by mouth every 6 (six) hours as needed for severe pain (please minimize narcotics).   sertraline 50 MG tablet Commonly known as:  ZOLOFT Take 50 mg by mouth daily.   Vitamin D (Ergocalciferol) 1.25 MG (50000 UT) Caps capsule Commonly known as:  DRISDOL Take 50,000 Units by mouth 2 (two) times a week.      Follow-up Information    Clovis Riley, MD. Go on 04/06/2019.   Specialty:  General Surgery Why:  at 920 am.  Please arrive 15 minutes early for your appointment.  Thank you. Contact information: [address] [place] [address] [phone]        Clovis Riley, MD .   Specialty:  General Surgery Contact information: [address] [place] [address] [phone]            The results of significant diagnostics from this hospitalization (including imaging,  microbiology, ancillary and laboratory) are listed below for reference.    Significant Diagnostic Studies: No results found.  Labs: Basic Metabolic Panel: Recent Labs  Lab 03/06/19 0848 03/08/19 0516  NA 139 140  K 4.0 4.2  CL 105 107  CO2 24 22  GLUCOSE 121* 133*  BUN 16 12  CREATININE 0.73 0.72  CALCIUM 9.6 8.7*   Liver Function Tests: Recent Labs  Lab 03/06/19 0848 03/08/19 0516  AST 77* 103*  ALT 102* 121*  ALKPHOS 57 45  BILITOT 0.6 0.9  PROT 7.9 6.9  ALBUMIN 4.7 3.7    CBC: Recent Labs  Lab 03/06/19 0848 03/08/19 0516 03/09/19 0434  WBC 6.5 9.7 8.8  NEUTROABS 3.5 7.3 5.3  HGB 13.7 11.2* 10.7*  HCT 44.3 36.0 35.0*  MCV 88.2 87.6 89.5  PLT 297 253 216    CBG: No results for input(s): GLUCAP in the last 168 hours.  Active Problems:   Morbid obesity ([place])   Signed:  Clovis Riley, [facility], [place] 03/09/2019, 10:18 AM

## 2019-03-09 NOTE — Progress Notes (Signed)
Pt was discharged home today. Instructions were reviewed with patient, and questions were answered. Pt was taken to main entrance via wheelchair by NT.  

## 2019-03-09 NOTE — Progress Notes (Signed)
Patient alert and oriented, pain is controlled. Patient is tolerating fluids, advanced to protein shake today, patient is tolerating well. Reviewed Gastric Bypass discharge instructions with patient and patient is able to articulate understanding. Provided information on BELT program, Support Group and [facility] outpatient pharmacy. All questions answered, will continue to monitor.  Total fluid intake 970 Per dehydration protocol call back one week post op

## 2019-03-13 ENCOUNTER — Other Ambulatory Visit: Payer: Self-pay | Admitting: *Deleted

## 2019-03-13 ENCOUNTER — Telehealth (HOSPITAL_COMMUNITY): Payer: Self-pay

## 2019-03-13 NOTE — Patient Outreach (Signed)
[facility]) Care Management  03/13/2019  Ashley Paul May 21, 1971 [id number]   Transition of care telephone call  Referral received: 02/16/19 Initial outreach: 03/13/19 Surgery/procedure date: 03/07/19 Insurance: [place]   Initial unsuccessful telephone call to patient's preferred number in order to complete transition of care assessment; no answer, left HIPAA compliant voicemail message requesting return call.   Objective: Ashley Paul had laparoscopic gastric sleeve surgery on 03/07/19 at [facility].  Comorbidities include: HTN, Hypertriglyceridemia, Chronic lumbar radiculopathy, Granuloma of orbit, Orbital tumor,and Prediabetes  She was discharged to home on 03/09/19 without the need for home health services or durable medical equipment per the discharge summary.   Plan: If no return call from patient by the end of business day today, this RNCM will route unsuccessful outreach letter with [facility] pamphlet and 24 hour Nurse Advice Line Magn[place] to [facility] clinical pool to be mailed to patient's home address. This RNCM will attempt another outreach within 4 business days.  Barrington Ellison RN,CCM,CDE [facility] Management Coordinator Office Phone [phone] Office Fax [phone]

## 2019-03-13 NOTE — Telephone Encounter (Addendum)
Patient called to discuss post bariatric surgery follow up questions.  See below:   1.  Tell me about your pain and pain management?denies  2.  Let's talk about fluid intake.  How much total fluid are you taking in?64 ounces  3.  How much protein have you taken in the last 2 days?60 grams of protein  4.  Have you had nausea?  Tell me about when have experienced nausea and what you did to help?denies  5.  Has the frequency or color changed with your urine?frequent urination  6.  Tell me what your incisions look like?no problems  7.  Have you been passing gas? BM?one day of discharge  8.  If a problem or question were to arise who would you call?  Do you know contact numbers for [place], [facility], and [facility]?  9.  How has the walking going?regularly  10.  How are your vitamins and calcium going?  How are you taking them?MVi and calcium taking appropriately

## 2019-03-14 ENCOUNTER — Encounter: Payer: Self-pay | Admitting: *Deleted

## 2019-03-14 NOTE — Patient Outreach (Signed)
Transition of care call and template completed  on 1/42/39.  Ashley Paul had her surgery and was discharged home on 03/09/19. She is recovering without complications at home with her husband and daughter in attendance. She is able to take in water and protein shakes although she is not quite at goal for her protein intake. She is at 50 gms currently. She has had a return of bowel function. Surgical site is without signs of infection. She only needs APAP or Ibuprofen for pain at this time. She has her follow up appt scheduled. She has transportation. She has all her medications.  Ashley Paul does not feel she has any other needs from [facility] at this time.  She was reminded to complete her [facility], Health Assessment by the requested date. She advises that she will do that task before that date.  Will send a successful outreach letter.  Ashley Paul. Ashley Neither, MSN, [facility] Gerontological Nurse Practitioner [facility] Care Management [phone]

## 2019-03-16 ENCOUNTER — Ambulatory Visit: Payer: Self-pay | Admitting: *Deleted

## 2019-03-17 DIAGNOSIS — E559 Vitamin D deficiency, unspecified: Secondary | ICD-10-CM | POA: Diagnosis not present

## 2019-03-17 DIAGNOSIS — R945 Abnormal results of liver function studies: Secondary | ICD-10-CM | POA: Diagnosis not present

## 2019-03-17 DIAGNOSIS — I10 Essential (primary) hypertension: Secondary | ICD-10-CM | POA: Diagnosis not present

## 2019-03-21 ENCOUNTER — Encounter: Payer: 59 | Attending: Surgery | Admitting: Skilled Nursing Facility1

## 2019-03-21 ENCOUNTER — Ambulatory Visit: Payer: Self-pay

## 2019-03-21 ENCOUNTER — Other Ambulatory Visit: Payer: Self-pay

## 2019-03-21 ENCOUNTER — Telehealth: Payer: Self-pay | Admitting: Skilled Nursing Facility1

## 2019-03-21 ENCOUNTER — Other Ambulatory Visit: Payer: Self-pay | Admitting: Family Medicine

## 2019-03-21 DIAGNOSIS — E669 Obesity, unspecified: Secondary | ICD-10-CM | POA: Insufficient documentation

## 2019-03-21 DIAGNOSIS — R945 Abnormal results of liver function studies: Principal | ICD-10-CM

## 2019-03-21 DIAGNOSIS — R7989 Other specified abnormal findings of blood chemistry: Secondary | ICD-10-CM

## 2019-03-21 NOTE — Telephone Encounter (Signed)
Post-Op appointment via phone due to pt not wanting to come in due to fear of the corona virus. Pt states while at work she forgets to drink fluid stating she drinks about 40-65 fluid ounces.   Pt states she has a bowel movement about every other day with the use of a stool softener.   Pt states her dosage for her hypertension medication has been lowered.   2 Week Post-Operative Nutrition Class  The following the learning objectives were met by the patient during this course:  Identifies Phase 3A (Soft, High Proteins) Dietary Goals and will begin from 2 weeks post-operatively to 2 months post-operatively  Identifies appropriate sources of fluids and proteins   States protein recommendations and appropriate sources post-operatively  Identifies the need for appropriate texture modifications, mastication, and bite sizes when consuming solids  Identifies appropriate multivitamin and calcium sources post-operatively  Describes the need for physical activity post-operatively and will follow MD recommendations  States when to call healthcare provider regarding medication questions or post-operative complications  Handouts given during class include:  Phase 3A: Soft, High Protein Diet Handout  Follow-Up Plan: Patient will follow-up at [facility] in 6 weeks for 2 month post-op nutrition visit for diet advancement per MD.  May 4th 11am.

## 2019-03-22 NOTE — Progress Notes (Signed)
Post-Op appointment via phone due to pt not wanting to come in due to fear of the corona virus. Pt states while at work she forgets to drink fluid stating she drinks about 40-65 fluid ounces.   Pt states she has a bowel movement about every other day with the use of a stool softener.   Pt states her dosage for her hypertension medication has been lowered.   2 Week Post-Operative Nutrition Class  The following the learning objectives were met by the patient during this course:  Identifies Phase 3A (Soft, High Proteins) Dietary Goals and will begin from 2 weeks post-operatively to 2 months post-operatively  Identifies appropriate sources of fluids and proteins   States protein recommendations and appropriate sources post-operatively  Identifies the need for appropriate texture modifications, mastication, and bite sizes when consuming solids  Identifies appropriate multivitamin and calcium sources post-operatively  Describes the need for physical activity post-operatively and will follow MD recommendations  States when to call healthcare provider regarding medication questions or post-operative complications  Handouts given during class include:  Phase 3A: Soft, High Protein Diet Handout  Follow-Up Plan: Patient will follow-up at [facility] in 6 weeks for 2 month post-op nutrition visit for diet advancement per MD.  May 4th 11am.    

## 2019-04-18 ENCOUNTER — Ambulatory Visit: Payer: 59

## 2019-04-19 DIAGNOSIS — F411 Generalized anxiety disorder: Secondary | ICD-10-CM | POA: Diagnosis not present

## 2019-05-01 ENCOUNTER — Ambulatory Visit: Payer: Self-pay | Admitting: Skilled Nursing Facility1

## 2019-05-30 ENCOUNTER — Other Ambulatory Visit: Payer: Self-pay | Admitting: Family Medicine

## 2019-05-30 DIAGNOSIS — R7989 Other specified abnormal findings of blood chemistry: Secondary | ICD-10-CM

## 2019-05-31 MED FILL — ONDANSETRON ODT 4 MG TABLET: 4 | 7 days supply | Qty: 20 | Fill #0

## 2019-06-09 ENCOUNTER — Other Ambulatory Visit: Payer: Self-pay | Admitting: Family Medicine

## 2019-06-09 DIAGNOSIS — R928 Other abnormal and inconclusive findings on diagnostic imaging of breast: Secondary | ICD-10-CM

## 2019-07-03 ENCOUNTER — Ambulatory Visit
Admission: RE | Admit: 2019-07-03 | Discharge: 2019-07-03 | Disposition: A | Discharge status: Home / Self Care | Payer: 59 | Source: Ambulatory Visit | Attending: Family Medicine | Admitting: Family Medicine

## 2019-07-03 DIAGNOSIS — N6001 Solitary cyst of right breast: Secondary | ICD-10-CM | POA: Diagnosis not present

## 2019-07-03 DIAGNOSIS — R922 Inconclusive mammogram: Secondary | ICD-10-CM | POA: Diagnosis not present

## 2019-07-03 DIAGNOSIS — R928 Other abnormal and inconclusive findings on diagnostic imaging of breast: Secondary | ICD-10-CM | POA: Diagnosis not present

## 2019-07-03 DIAGNOSIS — N6314 Unspecified lump in the right breast, lower inner quadrant: Secondary | ICD-10-CM | POA: Diagnosis not present

## 2019-07-04 ENCOUNTER — Other Ambulatory Visit: Payer: Self-pay | Admitting: Family Medicine

## 2019-07-04 DIAGNOSIS — R928 Other abnormal and inconclusive findings on diagnostic imaging of breast: Secondary | ICD-10-CM

## 2019-07-04 DIAGNOSIS — N6310 Unspecified lump in the right breast, unspecified quadrant: Secondary | ICD-10-CM

## 2019-07-10 DIAGNOSIS — H903 Sensorineural hearing loss, bilateral: Secondary | ICD-10-CM | POA: Diagnosis not present

## 2019-07-10 DIAGNOSIS — H9319 Tinnitus, unspecified ear: Secondary | ICD-10-CM | POA: Diagnosis not present

## 2019-07-10 DIAGNOSIS — E041 Nontoxic single thyroid nodule: Secondary | ICD-10-CM | POA: Diagnosis not present

## 2019-07-12 DIAGNOSIS — E041 Nontoxic single thyroid nodule: Secondary | ICD-10-CM | POA: Diagnosis not present

## 2019-07-17 ENCOUNTER — Ambulatory Visit: Payer: 59

## 2019-07-19 ENCOUNTER — Other Ambulatory Visit: Payer: Self-pay

## 2019-07-19 ENCOUNTER — Ambulatory Visit
Admission: RE | Admit: 2019-07-19 | Discharge: 2019-07-19 | Disposition: A | Discharge status: Home / Self Care | Payer: 59 | Source: Ambulatory Visit | Attending: Family Medicine | Admitting: Family Medicine

## 2019-07-19 DIAGNOSIS — R928 Other abnormal and inconclusive findings on diagnostic imaging of breast: Secondary | ICD-10-CM | POA: Diagnosis not present

## 2019-07-19 DIAGNOSIS — N6021 Fibroadenosis of right breast: Secondary | ICD-10-CM | POA: Diagnosis not present

## 2019-07-19 DIAGNOSIS — Z9889 Other specified postprocedural states: Secondary | ICD-10-CM | POA: Diagnosis not present

## 2019-07-19 DIAGNOSIS — N6314 Unspecified lump in the right breast, lower inner quadrant: Secondary | ICD-10-CM | POA: Insufficient documentation

## 2019-07-19 DIAGNOSIS — N6310 Unspecified lump in the right breast, unspecified quadrant: Secondary | ICD-10-CM

## 2019-07-19 DIAGNOSIS — Z1239 Encounter for other screening for malignant neoplasm of breast: Secondary | ICD-10-CM | POA: Diagnosis not present

## 2019-07-19 HISTORY — PX: BREAST BIOPSY: SHX20

## 2019-07-20 LAB — SURGICAL PATHOLOGY

## 2019-07-27 DIAGNOSIS — H905 Unspecified sensorineural hearing loss: Secondary | ICD-10-CM | POA: Diagnosis not present

## 2019-08-24 DIAGNOSIS — Z0000 Encounter for general adult medical examination without abnormal findings: Secondary | ICD-10-CM | POA: Diagnosis not present

## 2019-08-24 DIAGNOSIS — Z1211 Encounter for screening for malignant neoplasm of colon: Secondary | ICD-10-CM | POA: Diagnosis not present

## 2019-08-24 DIAGNOSIS — I10 Essential (primary) hypertension: Secondary | ICD-10-CM | POA: Diagnosis not present

## 2019-08-25 DIAGNOSIS — I10 Essential (primary) hypertension: Secondary | ICD-10-CM | POA: Diagnosis not present

## 2019-08-25 DIAGNOSIS — Z0000 Encounter for general adult medical examination without abnormal findings: Secondary | ICD-10-CM | POA: Diagnosis not present

## 2019-10-12 DIAGNOSIS — R109 Unspecified abdominal pain: Secondary | ICD-10-CM | POA: Diagnosis not present

## 2019-10-12 DIAGNOSIS — K912 Postsurgical malabsorption, not elsewhere classified: Secondary | ICD-10-CM | POA: Diagnosis not present

## 2019-10-12 DIAGNOSIS — Z09 Encounter for follow-up examination after completed treatment for conditions other than malignant neoplasm: Secondary | ICD-10-CM | POA: Diagnosis not present

## 2019-10-12 MED FILL — OMEPRAZOLE DR 40 MG CAPSULE: 40 | 30 days supply | Qty: 30 | Fill #0

## 2020-02-06 NOTE — Telephone Encounter (Signed)
error 

## 2020-02-21 DIAGNOSIS — M5430 Sciatica, unspecified side: Secondary | ICD-10-CM | POA: Insufficient documentation

## 2020-02-21 DIAGNOSIS — M545 Low back pain, unspecified: Secondary | ICD-10-CM | POA: Insufficient documentation

## 2020-02-21 DIAGNOSIS — M7632 Iliotibial band syndrome, left leg: Secondary | ICD-10-CM | POA: Diagnosis not present

## 2020-02-21 DIAGNOSIS — M5416 Radiculopathy, lumbar region: Secondary | ICD-10-CM | POA: Diagnosis not present

## 2020-03-19 DIAGNOSIS — M545 Low back pain: Secondary | ICD-10-CM | POA: Diagnosis not present

## 2020-03-19 DIAGNOSIS — M5432 Sciatica, left side: Secondary | ICD-10-CM | POA: Diagnosis not present

## 2020-04-11 DIAGNOSIS — M5136 Other intervertebral disc degeneration, lumbar region: Secondary | ICD-10-CM | POA: Diagnosis not present

## 2020-04-25 DIAGNOSIS — M545 Low back pain: Secondary | ICD-10-CM | POA: Diagnosis not present

## 2020-04-25 DIAGNOSIS — M5416 Radiculopathy, lumbar region: Secondary | ICD-10-CM | POA: Diagnosis not present

## 2020-05-23 ENCOUNTER — Institutional Professional Consult (permissible substitution): Payer: 59 | Admitting: Plastic Surgery

## 2020-05-29 ENCOUNTER — Telehealth: Payer: 59 | Admitting: Nurse Practitioner

## 2020-05-29 DIAGNOSIS — H01119 Allergic dermatitis of unspecified eye, unspecified eyelid: Secondary | ICD-10-CM

## 2020-05-29 NOTE — Progress Notes (Signed)
Based on what you shared with me it looks like you have some type of dermatitis of the eyelids ,that should be evaluated in a face to face office visit. You really need to see a dermatologit. Due the the location of rash, this can be difficult to treat becaue you cannot use steroid cream on the face. Just contact your PCP and get a referral to dermatology if it is required by your insurance.    NOTE: If you entered your credit card information for this eVisit, you will not be charged. You may see a "hold" on your card for the $35 but that hold will drop off and you will not have a charge processed.  If you are having a true medical emergency please call 911.     For an urgent face to face visit, [facility] has four urgent care centers for your convenience:   . [facility]    [phone]                  Get Driving Directions  [id number] [place], [place] [address] . 10 am to 8 pm Monday-Friday . 12 pm to 8 pm Saturday-Sunday   . [facility] at [place]                  Get Driving Directions  [phone] [place], [place], [place] [address] . 8 am to 8 pm Monday-Friday . 9 am to 6 pm Saturday . 11 am to 6 pm Sunday   . [facility] at [place]                  Get Driving Directions   [address] [place], [place] [address] . 8 am to 8 pm Monday-Friday . 8 am to 4 pm Saturday-Sunday    . [facility] at [place]                    Get Driving Directions  [id number]  [address] [place], [place] [address]  . Monday-Friday, 12 PM to 6 PM    Your e-visit answers were reviewed by a board certified advanced clinical practitioner to complete your personal care plan.  Thank you for using e-Visits.

## 2020-06-06 DIAGNOSIS — L2089 Other atopic dermatitis: Secondary | ICD-10-CM | POA: Diagnosis not present

## 2020-06-12 ENCOUNTER — Institutional Professional Consult (permissible substitution): Payer: 59 | Admitting: Plastic Surgery

## 2020-06-17 DIAGNOSIS — S39012A Strain of muscle, fascia and tendon of lower back, initial encounter: Secondary | ICD-10-CM | POA: Diagnosis not present

## 2020-06-20 DIAGNOSIS — M545 Low back pain: Secondary | ICD-10-CM | POA: Diagnosis not present

## 2020-08-27 DIAGNOSIS — Z1231 Encounter for screening mammogram for malignant neoplasm of breast: Secondary | ICD-10-CM | POA: Diagnosis not present

## 2020-08-27 DIAGNOSIS — Z9884 Bariatric surgery status: Secondary | ICD-10-CM | POA: Diagnosis not present

## 2020-08-27 DIAGNOSIS — I10 Essential (primary) hypertension: Secondary | ICD-10-CM | POA: Diagnosis not present

## 2020-08-27 DIAGNOSIS — Z0000 Encounter for general adult medical examination without abnormal findings: Secondary | ICD-10-CM | POA: Diagnosis not present

## 2020-08-27 DIAGNOSIS — Z1211 Encounter for screening for malignant neoplasm of colon: Secondary | ICD-10-CM | POA: Diagnosis not present

## 2020-09-17 DIAGNOSIS — H524 Presbyopia: Secondary | ICD-10-CM | POA: Diagnosis not present

## 2020-10-03 ENCOUNTER — Encounter (HOSPITAL_COMMUNITY): Payer: Self-pay

## 2020-12-12 ENCOUNTER — Other Ambulatory Visit: Payer: Self-pay | Admitting: Chiropractic Medicine

## 2020-12-12 DIAGNOSIS — M5416 Radiculopathy, lumbar region: Secondary | ICD-10-CM | POA: Diagnosis not present

## 2020-12-12 DIAGNOSIS — M545 Low back pain, unspecified: Secondary | ICD-10-CM | POA: Diagnosis not present

## 2020-12-12 DIAGNOSIS — M25512 Pain in left shoulder: Secondary | ICD-10-CM | POA: Diagnosis not present

## 2020-12-22 ENCOUNTER — Other Ambulatory Visit: Payer: Self-pay

## 2020-12-22 ENCOUNTER — Encounter: Payer: Self-pay | Admitting: Emergency Medicine

## 2020-12-22 ENCOUNTER — Ambulatory Visit
Admission: EM | Admit: 2020-12-22 | Discharge: 2020-12-22 | Disposition: A | Discharge status: Home / Self Care | Payer: 59 | Attending: Sports Medicine | Admitting: Sports Medicine

## 2020-12-22 DIAGNOSIS — R350 Frequency of micturition: Secondary | ICD-10-CM | POA: Diagnosis not present

## 2020-12-22 DIAGNOSIS — N390 Urinary tract infection, site not specified: Secondary | ICD-10-CM | POA: Diagnosis not present

## 2020-12-22 DIAGNOSIS — R300 Dysuria: Secondary | ICD-10-CM | POA: Diagnosis not present

## 2020-12-22 LAB — URINALYSIS, COMPLETE (UACMP) WITH MICROSCOPIC
Glucose, UA: NEGATIVE mg/dL
Ketones, ur: 15 mg/dL — AB
Leukocytes,Ua: NEGATIVE
Nitrite: POSITIVE — AB
Specific Gravity, Urine: 1.025 (ref 1.005–1.030)
pH: 5.5 (ref 5.0–8.0)

## 2020-12-22 MED ORDER — NITROFURANTOIN MONOHYD MACRO 100 MG PO CAPS: 100.0000 mg | ORAL_CAPSULE | Freq: Two times a day (BID) | ORAL | 0 refills | Status: DC

## 2020-12-22 NOTE — Discharge Instructions (Addendum)
Recommended going ahead and checking a UA. If it is positive will send off for culture and treat with antibiotics. UA was positive for UTI.  Will send for culture.  Prescribed Macrobid as directed. Educational handout given including over-the-counter meds. Flushing her system with plenty of fluids was recommended. She is status post gastric bypass and does not want to drink cranberry juice. She will just take the cranberry pills and the Azo as needed. Supportive care. Follow-up here as needed.

## 2020-12-22 NOTE — ED Provider Notes (Signed)
[facility]    CSN: [id number] Arrival date & time: 12/22/20  [id number]      History   Chief Complaint Chief Complaint  Patient presents with  . Dysuria    HPI Ashley Paul is a 49 y.o. female.   Pleasant 49 year old female who presents for evaluation of the above complaint.  She is noted 3 days of pain on urination with urgency increased frequency and decreased amount of urine each time. No fever shakes chills. No significant abdominal pain. She hasn't noted any blood in her urine. She does not have recurrent UTIs. No history of kidney stones. She has been taking cranberry pills and Azo over-the-counter. Given her symptoms are not improving she comes in today for evaluation and management. No red flag signs or symptoms.     Past Medical History:  Diagnosis Date  . Allergy   . Anxiety   . Arthritis    neck, lower back  . Depression   . GERD (gastroesophageal reflux disease)   . Goiter   . Hypertension   . Obesity     Patient Active Problem List   Diagnosis Date Noted  . Morbid obesity ([place]) 03/07/2019  . Chronic lumbar radiculopathy 06/03/2018  . Osteoarthritis of spine with radiculopathy, cervical region 10/20/2017  . Hypertriglyceridemia 11/05/2014  . Prediabetes 11/05/2014  . Essential hypertension 11/01/2014  . Obesity, unspecified obesity severity, unspecified obesity type 11/01/2014  . Psoriasis 11/01/2014  . 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 Lacrimal gland inflammation 07/04/2014  . 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 BREAST BIOPSY Right 07/19/2019   [place] bx path pending coil clip  . BREAST CYST EXCISION Right 1993  . CATARACT EXTRACTION W/PHACO Left 12/26/2018   Procedure: CATARACT EXTRACTION PHACO AND INTRAOCULAR LENS PLACEMENT ([place])  LEFT;  Surgeon: Eulogio Bear, MD;  Location: [place];  Service: Ophthalmology;  Laterality: Left;  . CATARACT EXTRACTION W/PHACO Right 02/06/2019    Procedure: CATARACT EXTRACTION PHACO AND INTRAOCULAR LENS PLACEMENT ([place])  RIGHT;  Surgeon: Eulogio Bear, MD;  Location: [place];  Service: Ophthalmology;  Laterality: Right;  . CERVICAL DISC ARTHROPLASTY N/A 10/20/2017   Procedure: ARTIFICIAL DISC REPLACEMENT CERVICAL FIVE- CERVICAL SIX;  Surgeon: Ashok Pall, MD;  Location: [place];  Service: Neurosurgery;  Laterality: N/A;  . CESAREAN SECTION    . CHOLECYSTECTOMY    . EYE SURGERY Right    bx 2015  . GASTRIC ROUX-EN-Y  03/07/2019   Procedure: LAPAROSCOPIC ROUX-EN-Y GASTRIC BYPASS WITH UPPER ENDOSCOPY;  Surgeon: Clovis Riley, MD;  Location: [facility];  Service: General;;  . TUBAL LIGATION      OB History   No obstetric history on file.      Home Medications    Prior to Admission medications   Medication Sig Start Date End Date Taking? Authorizing Provider  sertraline (ZOLOFT) 50 MG tablet Take 50 mg by mouth daily.    Yes [provider]  acetaminophen (TYLENOL) 325 MG tablet Take 650 mg by mouth every 6 (six) hours as needed for headache (pain).    [provider]  albuterol (PROVENTIL HFA;VENTOLIN HFA) 108 (90 Base) MCG/ACT inhaler Inhale 2 puffs into the lungs every 4 (four) hours as needed for wheezing or shortness of breath. 11/18/18   Darlin Priestly, PA-C  beclomethasone (QVAR REDIHALER) 80 MCG/ACT inhaler Inhale 1 puff into the lungs 2 (two) times daily. 12/07/18   Darlin Priestly, PA-C  esomeprazole ([place])  20 MG packet Take 20 mg by mouth daily before breakfast. 03/09/19   Clovis Riley, MD  gabapentin (NEURONTIN) 100 MG capsule Take 2 capsules (200 mg total) by mouth every 12 (twelve) hours for 7 days. 03/09/19 03/16/19  Clovis Riley, MD  hydrOXYzine (ATARAX/VISTARIL) 50 MG tablet Take 50 mg by mouth every 8 (eight) hours as needed for anxiety.  09/06/18   [provider]  losartan (COZAAR) 100 MG tablet Take 0.5 tablets (50 mg total) by mouth daily. 03/09/19   Clovis Riley, MD  nitrofurantoin, macrocrystal-monohydrate, (MACROBID) 100 MG capsule Take 1 capsule (100 mg total) by mouth 2 (two) times daily. 12/22/20   Verda Cumins, MD  ondansetron (ZOFRAN ODT) 4 MG disintegrating tablet Take 1 tablet (4 mg total) by mouth every 8 (eight) hours as needed for nausea or vomiting. 03/09/19   Clovis Riley, MD  oxyCODONE (ROXICODONE) 5 MG/5ML solution Take 5 mLs (5 mg total) by mouth every 6 (six) hours as needed for severe pain (please minimize narcotics). 03/09/19   Clovis Riley, MD  Vitamin D, Ergocalciferol, (DRISDOL) 1.25 MG (50000 UT) CAPS capsule Take 50,000 Units by mouth 2 (two) times a week.  04/23/18   [provider]    Family History Family History  Problem Relation Age of Onset  . Heart disease Paternal Grandfather   . Colon cancer Neg Hx   . Esophageal cancer Neg Hx   . Pancreatic cancer Neg Hx   . Stomach cancer Neg Hx   . Liver disease Neg Hx   . Rectal cancer Neg Hx   . Breast cancer Neg Hx     Social History Social History   Tobacco Use  . Smoking status: Former Smoker    Packs/day: 1.00    Years: 27.00    Pack years: 27.00    Types: Cigarettes    Quit date: 2011    Years since quitting: 10.9  . Smokeless tobacco: Never Used  Vaping Use  . Vaping Use: Never used  Substance Use Topics  . Alcohol use: No  . Drug use: No     Allergies   Azathioprine, Bupropion, Lisinopril, Mycophenolate mofetil, Sulfa antibiotics, Pantoprazole sodium, Singulair [montelukast], and Tramadol   Review of Systems Review of Systems  Constitutional: Negative for activity change, appetite change, chills, fatigue and fever.  HENT: Negative.   Respiratory: Negative.   Cardiovascular: Negative.   Gastrointestinal: Negative for abdominal pain, constipation, diarrhea, nausea and vomiting.  Genitourinary: Positive for dysuria, frequency and urgency. Negative for flank pain and pelvic pain.  Skin: Negative for color change,  pallor, rash and wound.  Neurological: Negative for dizziness, light-headedness and headaches.  All other systems reviewed and are negative.    Physical Exam Triage Vital Signs ED Triage Vitals  Enc Vitals Group     BP 12/22/20 1016 110/86     Pulse Rate 12/22/20 1016 66     Resp 12/22/20 1016 14     Temp 12/22/20 1016 97.8 F (36.6 C)     Temp Source 12/22/20 1016 Oral     SpO2 12/22/20 1016 100 %     Weight 12/22/20 1017 153 lb (69.4 kg)     Height 12/22/20 1017 5\' 7"  (1.702 m)     Head Circumference --      Peak Flow --      Pain Score 12/22/20 1016 6     Pain Loc --      Pain Edu? --  Excl. in [place]? --    No data found.  Updated Vital Signs BP 110/86 (BP Location: Right Arm)   Pulse 66   Temp 97.8 F (36.6 C) (Oral)   Resp 14   Ht 5\' 7"  (1.702 m)   Wt 69.4 kg   LMP 12/08/2020 (Approximate)   SpO2 100%   BMI 23.96 kg/m   Visual Acuity Right Eye Distance:   Left Eye Distance:   Bilateral Distance:    Right Eye Near:   Left Eye Near:    Bilateral Near:     Physical Exam   UC Treatments / Results  Labs (all labs ordered are listed, but only abnormal results are displayed) Labs Reviewed  URINALYSIS, COMPLETE (UACMP) WITH MICROSCOPIC - Abnormal; Notable for the following components:      Result Value   APPearance HAZY (*)    Hgb urine dipstick MODERATE (*)    Bilirubin Urine SMALL (*)    Ketones, ur 15 (*)    Protein, ur TRACE (*)    Nitrite POSITIVE (*)    Bacteria, UA MANY (*)    All other components within normal limits  URINE CULTURE    EKG   Radiology No results found.  Procedures Procedures (including critical care time)  Medications Ordered in UC Medications - No data to display  Initial Impression / Assessment and Plan / UC Course  I have reviewed the triage vital signs and the nursing notes.  Pertinent labs & imaging results that were available during my care of the patient were reviewed by me and considered in my medical  decision making (see chart for details).  Clinical impression: Pleasant 49 year old female who comes in with dysuria, increased urinary frequency, symptoms are consistent with a UTI.  Treatment plan: 1. The findings and treatment plan were discussed in detail with the patient. Patient was in agreement. 2. Recommended going ahead and checking a UA. If it is positive will send off for culture and treat with antibiotics.  UA was positive for UTI.  Will send for culture.  Prescribed Macrobid as directed. 3. Educational handout given including over-the-counter meds. Flushing her system with plenty of fluids was recommended. She is status post gastric bypass and does not want to drink cranberry juice. She will just take the cranberry pills and the Azo as needed. 4. Supportive care. 5. Follow-up here as needed.     Final Clinical Impressions(s) / UC Diagnoses   Final diagnoses:  Lower urinary tract infectious disease  Urinary frequency  Dysuria     Discharge Instructions     Recommended going ahead and checking a UA. If it is positive will send off for culture and treat with antibiotics. UA was positive for UTI.  Will send for culture.  Prescribed Macrobid as directed. Educational handout given including over-the-counter meds. Flushing her system with plenty of fluids was recommended. She is status post gastric bypass and does not want to drink cranberry juice. She will just take the cranberry pills and the Azo as needed. Supportive care. Follow-up here as needed.    ED Prescriptions    Medication Sig Dispense Auth. Provider   nitrofurantoin, macrocrystal-monohydrate, (MACROBID) 100 MG capsule Take 1 capsule (100 mg total) by mouth 2 (two) times daily. 10 capsule Verda Cumins, MD     PDMP not reviewed this encounter.   Verda Cumins, MD 12/22/20 1050

## 2020-12-22 NOTE — ED Triage Notes (Signed)
Patient c/o burning when urinating and urinary urgency that started yesterday.   

## 2020-12-23 ENCOUNTER — Ambulatory Visit: Payer: Self-pay

## 2020-12-24 ENCOUNTER — Telehealth (HOSPITAL_COMMUNITY): Payer: Self-pay | Admitting: Emergency Medicine

## 2020-12-24 LAB — URINE CULTURE: Culture: >=100000 — AB

## 2020-12-24 MED ORDER — CEPHALEXIN 500 MG PO CAPS: 500.0000 mg | ORAL_CAPSULE | Freq: Two times a day (BID) | ORAL | 0 refills | Status: AC

## 2020-12-26 ENCOUNTER — Other Ambulatory Visit: Payer: Self-pay | Admitting: Family Medicine

## 2021-01-02 ENCOUNTER — Other Ambulatory Visit: Payer: Self-pay | Admitting: Chiropractic Medicine

## 2021-01-28 ENCOUNTER — Other Ambulatory Visit: Payer: Self-pay | Admitting: Family Medicine

## 2021-02-04 DIAGNOSIS — M5416 Radiculopathy, lumbar region: Secondary | ICD-10-CM | POA: Diagnosis not present

## 2021-02-14 ENCOUNTER — Other Ambulatory Visit: Payer: Self-pay | Admitting: Family Medicine

## 2021-02-14 DIAGNOSIS — Z1231 Encounter for screening mammogram for malignant neoplasm of breast: Secondary | ICD-10-CM

## 2021-03-03 ENCOUNTER — Other Ambulatory Visit: Payer: Self-pay

## 2021-03-03 ENCOUNTER — Ambulatory Visit
Admission: RE | Admit: 2021-03-03 | Discharge: 2021-03-03 | Disposition: A | Discharge status: Home / Self Care | Payer: 59 | Source: Ambulatory Visit | Attending: Family Medicine | Admitting: Family Medicine

## 2021-03-03 DIAGNOSIS — Z1231 Encounter for screening mammogram for malignant neoplasm of breast: Secondary | ICD-10-CM

## 2021-05-15 ENCOUNTER — Other Ambulatory Visit: Payer: Self-pay

## 2021-05-15 MED ORDER — TRAMADOL HCL 50 MG PO TABS
ORAL_TABLET | ORAL | 0 refills | Status: DC
  Filled 2021-05-15: qty 60, 15d supply, fill #0

## 2021-05-22 ENCOUNTER — Other Ambulatory Visit: Payer: Self-pay

## 2021-05-22 MED FILL — Sertraline HCl Tab 50 MG: ORAL | 90 days supply | Qty: 90 | Fill #0 | Status: AC

## 2021-06-03 DIAGNOSIS — H26491 Other secondary cataract, right eye: Secondary | ICD-10-CM | POA: Diagnosis not present

## 2021-07-01 ENCOUNTER — Other Ambulatory Visit: Payer: Self-pay

## 2021-07-01 MED ORDER — TRAMADOL HCL 50 MG PO TABS
ORAL_TABLET | ORAL | 0 refills | Status: DC
  Filled 2021-07-01: qty 40, 10d supply, fill #0

## 2021-07-02 ENCOUNTER — Other Ambulatory Visit: Payer: Self-pay

## 2021-07-02 DIAGNOSIS — R232 Flushing: Secondary | ICD-10-CM | POA: Diagnosis not present

## 2021-07-02 DIAGNOSIS — R799 Abnormal finding of blood chemistry, unspecified: Secondary | ICD-10-CM | POA: Diagnosis not present

## 2021-07-02 DIAGNOSIS — Z9884 Bariatric surgery status: Secondary | ICD-10-CM | POA: Diagnosis not present

## 2021-07-02 DIAGNOSIS — I10 Essential (primary) hypertension: Secondary | ICD-10-CM | POA: Diagnosis not present

## 2021-07-02 DIAGNOSIS — R739 Hyperglycemia, unspecified: Secondary | ICD-10-CM | POA: Diagnosis not present

## 2021-07-02 MED ORDER — SERTRALINE HCL 50 MG PO TABS
ORAL_TABLET | ORAL | 1 refills | Status: DC
  Filled 2021-07-02 (×2): qty 90, 60d supply, fill #0
  Filled 2021-10-08: qty 90, 60d supply, fill #1

## 2021-07-09 ENCOUNTER — Other Ambulatory Visit: Payer: Self-pay

## 2021-07-09 DIAGNOSIS — H26491 Other secondary cataract, right eye: Secondary | ICD-10-CM | POA: Diagnosis not present

## 2021-09-10 ENCOUNTER — Other Ambulatory Visit: Payer: Self-pay

## 2021-09-10 MED ORDER — TRAMADOL HCL 50 MG PO TABS
ORAL_TABLET | ORAL | 0 refills | Status: DC
  Filled 2021-09-10: qty 40, 10d supply, fill #0

## 2021-09-10 MED ORDER — GABAPENTIN 100 MG PO CAPS
ORAL_CAPSULE | ORAL | 0 refills | Status: DC
  Filled 2021-09-10: qty 30, 30d supply, fill #0

## 2021-09-25 ENCOUNTER — Other Ambulatory Visit (HOSPITAL_BASED_OUTPATIENT_CLINIC_OR_DEPARTMENT_OTHER): Payer: Self-pay

## 2021-09-25 MED ORDER — INFLUENZA VAC SPLIT QUAD 0.5 ML IM SUSY
PREFILLED_SYRINGE | INTRAMUSCULAR | 0 refills | Status: DC
Start: 2021-09-25 — End: 2024-03-16
  Filled 2021-09-25: qty 0.5, 1d supply, fill #0

## 2021-09-26 ENCOUNTER — Other Ambulatory Visit (HOSPITAL_BASED_OUTPATIENT_CLINIC_OR_DEPARTMENT_OTHER): Payer: Self-pay

## 2021-10-03 ENCOUNTER — Encounter (HOSPITAL_COMMUNITY): Payer: Self-pay | Admitting: *Deleted

## 2021-10-08 ENCOUNTER — Other Ambulatory Visit: Payer: Self-pay

## 2021-10-31 ENCOUNTER — Other Ambulatory Visit: Payer: Self-pay

## 2021-10-31 DIAGNOSIS — M542 Cervicalgia: Secondary | ICD-10-CM | POA: Diagnosis not present

## 2021-10-31 MED ORDER — TRAMADOL HCL 50 MG PO TABS
ORAL_TABLET | ORAL | 0 refills | Status: DC
  Filled 2021-10-31: qty 40, 10d supply, fill #0

## 2021-11-03 ENCOUNTER — Other Ambulatory Visit: Payer: Self-pay

## 2021-12-08 ENCOUNTER — Other Ambulatory Visit: Payer: Self-pay

## 2021-12-08 MED ORDER — GABAPENTIN 100 MG PO CAPS
ORAL_CAPSULE | ORAL | 2 refills | Status: DC
  Filled 2021-12-08: qty 30, 30d supply, fill #0
  Filled 2022-12-01: qty 30, 30d supply, fill #1

## 2021-12-08 MED ORDER — TRAMADOL HCL 50 MG PO TABS
ORAL_TABLET | ORAL | 0 refills | Status: DC
  Filled 2021-12-08: qty 60, 15d supply, fill #0

## 2022-01-08 ENCOUNTER — Other Ambulatory Visit: Payer: Self-pay

## 2022-01-08 MED ORDER — SERTRALINE HCL 50 MG PO TABS
ORAL_TABLET | ORAL | 2 refills | Status: DC
  Filled 2022-01-08 – 2022-04-23 (×2): qty 90, 60d supply, fill #0
  Filled 2022-07-28: qty 82, 54d supply, fill #1

## 2022-01-21 ENCOUNTER — Other Ambulatory Visit: Payer: Self-pay

## 2022-01-21 MED ORDER — TRAMADOL HCL 50 MG PO TABS
ORAL_TABLET | ORAL | 0 refills | Status: DC
  Filled 2022-01-21: qty 60, 15d supply, fill #0

## 2022-03-09 ENCOUNTER — Other Ambulatory Visit: Payer: Self-pay

## 2022-03-09 DIAGNOSIS — Z1211 Encounter for screening for malignant neoplasm of colon: Secondary | ICD-10-CM

## 2022-03-09 MED ORDER — NA SULFATE-K SULFATE-MG SULF 17.5-3.13-1.6 GM/177ML PO SOLN
1.0000 | Freq: Once | ORAL | 0 refills | Status: AC
  Filled 2022-03-09: qty 354, 1d supply, fill #0

## 2022-03-09 NOTE — Progress Notes (Signed)
Gastroenterology Pre-Procedure Review ? ?Request Date: 04/20/2022 ?Requesting Physician: Dr. Marius Ditch ? ?PATIENT REVIEW QUESTIONS: The patient responded to the following health history questions as indicated:   ? ?1. Are you having any GI issues? no ?2. Do you have a personal history of Polyps? no ?3. Do you have a family history of Colon Cancer or Polyps? no ?4. Diabetes Mellitus? no ?5. Joint replacements in the past 12 months?no ?6. Major health problems in the past 3 months?no ?7. Any artificial heart valves, MVP, or defibrillator?no ?   ?MEDICATIONS & ALLERGIES:    ?Patient reports the following regarding taking any anticoagulation/antiplatelet therapy:   ?Plavix, Coumadin, Eliquis, Xarelto, Lovenox, Pradaxa, Brilinta, or Effient? no ?Aspirin? no ? ?Patient confirms/reports the following medications:  ?Current Outpatient Medications  ?Medication Sig Dispense Refill  ? acetaminophen (TYLENOL) 325 MG tablet Take 650 mg by mouth every 6 (six) hours as needed for headache (pain).    ? albuterol (PROVENTIL HFA;VENTOLIN HFA) 108 (90 Base) MCG/ACT inhaler Inhale 2 puffs into the lungs every 4 (four) hours as needed for wheezing or shortness of breath. 1 Inhaler 0  ? beclomethasone (QVAR REDIHALER) 80 MCG/ACT inhaler Inhale 1 puff into the lungs 2 (two) times daily. 1 Inhaler 0  ? esomeprazole (NEXIUM) 20 MG packet Take 20 mg by mouth daily before breakfast. 30 each 12  ? gabapentin (NEURONTIN) 100 MG capsule Take 2 capsules (200 mg total) by mouth every 12 (twelve) hours for 7 days. 28 capsule 0  ? gabapentin (NEURONTIN) 100 MG capsule TAKE 1 CAPSULE BY MOUTH DAILY AT BEDTIME. 30 capsule 0  ? gabapentin (NEURONTIN) 100 MG capsule Take 1 capsule every day by oral route at bedtime. 30 capsule 2  ? hydrOXYzine (ATARAX/VISTARIL) 50 MG tablet Take 50 mg by mouth every 8 (eight) hours as needed for anxiety.   8  ? influenza vac split quadrivalent PF (FLUARIX) 0.5 ML injection Inject into the muscle. 0.5 mL 0  ? losartan  (COZAAR) 100 MG tablet Take 0.5 tablets (50 mg total) by mouth daily. 30 tablet 0  ? nitrofurantoin, macrocrystal-monohydrate, (MACROBID) 100 MG capsule Take 1 capsule (100 mg total) by mouth 2 (two) times daily. 10 capsule 0  ? ondansetron (ZOFRAN ODT) 4 MG disintegrating tablet Take 1 tablet (4 mg total) by mouth every 8 (eight) hours as needed for nausea or vomiting. 20 tablet 0  ? oxyCODONE (ROXICODONE) 5 MG/5ML solution Take 5 mLs (5 mg total) by mouth every 6 (six) hours as needed for severe pain (please minimize narcotics). 50 mL 0  ? sertraline (ZOLOFT) 50 MG tablet Take 50 mg by mouth daily.     ? sertraline (ZOLOFT) 50 MG tablet Take 1.5 tablets (75 mg total) by mouth nightly 90 tablet 2  ? traMADol (ULTRAM) 50 MG tablet TAKE 1 TABLET BY MOUTH EVERY 6 HOURS PRN 60 tablet 0  ? Vitamin D, Ergocalciferol, (DRISDOL) 1.25 MG (50000 UT) CAPS capsule Take 50,000 Units by mouth 2 (two) times a week.     ? ?No current facility-administered medications for this visit.  ? ? ?Patient confirms/reports the following allergies:  ?Allergies  ?Allergen Reactions  ? Azathioprine Other (See Comments)  ?  Flu-like symptoms, tachycardia  ? Bupropion Hives  ? Lisinopril Cough  ? Mycophenolate Mofetil Hives  ? Sulfa Antibiotics Swelling  ?  SWELLING REACTION UNSPECIFIED   ? Pantoprazole Sodium Palpitations  ? Singulair [Montelukast] Anxiety  ?  anxiety  ? Tramadol Anxiety  ?  Severe anxiety  ? ? ?  No orders of the defined types were placed in this encounter. ? ? ?AUTHORIZATION INFORMATION ?Primary Insurance: ?1D#: ?Group #: ? ?Secondary Insurance: ?1D#: ?Group #: ? ?SCHEDULE INFORMATION: ?Date: 04/20/2022 ?Time: ?Location: [facility] ? ?

## 2022-03-13 ENCOUNTER — Other Ambulatory Visit: Payer: Self-pay

## 2022-03-17 ENCOUNTER — Other Ambulatory Visit: Payer: Self-pay

## 2022-03-17 MED ORDER — TRAMADOL HCL 50 MG PO TABS
ORAL_TABLET | ORAL | 0 refills | Status: DC
  Filled 2022-03-17: qty 60, 15d supply, fill #0

## 2022-03-23 ENCOUNTER — Ambulatory Visit
Admission: EM | Admit: 2022-03-23 | Discharge: 2022-03-23 | Disposition: A | Discharge status: Home / Self Care | Payer: 59 | Attending: Emergency Medicine | Admitting: Emergency Medicine

## 2022-03-23 ENCOUNTER — Other Ambulatory Visit: Payer: Self-pay

## 2022-03-23 ENCOUNTER — Encounter: Payer: Self-pay | Admitting: Emergency Medicine

## 2022-03-23 DIAGNOSIS — J4541 Moderate persistent asthma with (acute) exacerbation: Secondary | ICD-10-CM

## 2022-03-23 MED ORDER — PREDNISONE 10 MG PO TABS
ORAL_TABLET | Freq: Every day | ORAL | 0 refills | Status: DC
Start: 2022-03-23 — End: 2024-03-16
  Filled 2022-03-23: qty 42, 6d supply, fill #0

## 2022-03-23 MED ORDER — ALBUTEROL SULFATE HFA 108 (90 BASE) MCG/ACT IN AERS
2.0000 | INHALATION_SPRAY | RESPIRATORY_TRACT | 0 refills | Status: DC | PRN
  Filled 2022-03-23: qty 18, 25d supply, fill #0

## 2022-03-23 NOTE — ED Provider Notes (Signed)
?[place] ? ? ? ?CSN: [id number] ?Arrival date & time: 03/23/22  2951 ? ? ?  ? ?History   ?Chief Complaint ?Chief Complaint  ?Patient presents with  ? Nasal Congestion  ? Cough  ? ? ?HPI ?Ashley Paul is a 51 y.o. female.  ? ?Patient presents with nasal congestion, rhinorrhea, sinus pressure, nonproductive coughing, shortness of breath and wheezing for 8 days.  Symptoms are worsened at nighttime.  Has attempted use of Mucinex, Tylenol Sinus which have been somewhat helpful.  Tolerating food and liquids.  No known sick contacts.  History of allergies and reactive airway disease.  ? ?Past Medical History:  ?Diagnosis Date  ? Allergy   ? Anxiety   ? Arthritis   ? neck, lower back  ? Depression   ? GERD (gastroesophageal reflux disease)   ? Goiter   ? Hypertension   ? Obesity   ? ? ?Patient Active Problem List  ? Diagnosis Date Noted  ? Low back pain 02/21/2020  ? Sciatica 02/21/2020  ? Morbid obesity ([place]) 03/07/2019  ? RAD (reactive airway disease), moderate persistent, uncomplicated 88/41/6606  ? Chronic lumbar radiculopathy 06/03/2018  ? Osteoarthritis of spine with radiculopathy, cervical region 10/20/2017  ? Hypertriglyceridemia 11/05/2014  ? Prediabetes 11/05/2014  ? Essential hypertension 11/01/2014  ? Obesity, unspecified obesity severity, unspecified obesity type 11/01/2014  ? Psoriasis 11/01/2014  ? Vitamin D deficiency 11/01/2014  ? 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 Lacrimal gland inflammation 07/04/2014  ? 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 Thyroid nodule 10/26/2013  ? ? ?Past Surgical History:  ?Procedure Laterality Date  ? BREAST BIOPSY Right 07/19/2019  ? [place] bx fibroadeoma coil clip  ? BREAST CYST EXCISION Right 1993  ? CATARACT EXTRACTION W/PHACO Left 12/26/2018  ? Procedure: CATARACT EXTRACTION PHACO AND INTRAOCULAR LENS PLACEMENT ([place])  LEFT;  Surgeon: Eulogio Bear, MD;  Location: [place];  Service: Ophthalmology;  Laterality: Left;  ? CATARACT EXTRACTION W/PHACO Right  02/06/2019  ? Procedure: CATARACT EXTRACTION PHACO AND INTRAOCULAR LENS PLACEMENT ([place])  RIGHT;  Surgeon: Eulogio Bear, MD;  Location: [place];  Service: Ophthalmology;  Laterality: Right;  ? CERVICAL DISC ARTHROPLASTY N/A 10/20/2017  ? Procedure: ARTIFICIAL DISC REPLACEMENT CERVICAL FIVE- CERVICAL SIX;  Surgeon: Ashok Pall, MD;  Location: [place];  Service: Neurosurgery;  Laterality: N/A;  ? CESAREAN SECTION    ? CHOLECYSTECTOMY    ? EYE SURGERY Right   ? bx 2015  ? GASTRIC ROUX-EN-Y  03/07/2019  ? Procedure: LAPAROSCOPIC ROUX-EN-Y GASTRIC BYPASS WITH UPPER ENDOSCOPY;  Surgeon: Clovis Riley, MD;  [facility];  Service: General;;  ? TUBAL LIGATION    ? ? ?OB History   ?No obstetric history on file. ?  ? ? ? ?Home Medications   ? ?Prior to Admission medications   ?Medication Sig Start Date End Date Taking? Authorizing Provider  ?acetaminophen (TYLENOL) 325 MG tablet Take 650 mg by mouth every 6 (six) hours as needed for headache (pain).    [provider]  ?albuterol (PROVENTIL HFA;VENTOLIN HFA) 108 (90 Base) MCG/ACT inhaler Inhale 2 puffs into the lungs every 4 (four) hours as needed for wheezing or shortness of breath. 11/18/18   Darlin Priestly, PA-C  ?beclomethasone (QVAR REDIHALER) 80 MCG/ACT inhaler Inhale 1 puff into the lungs 2 (two) times daily. 12/07/18   Darlin Priestly, PA-C  ?esomeprazole (NEXIUM) 20 MG packet Take 20 mg by mouth daily before breakfast. 03/09/19   Clovis Riley, MD  ?gabapentin (  NEURONTIN) 100 MG capsule Take 2 capsules (200 mg total) by mouth every 12 (twelve) hours for 7 days. 03/09/19 03/16/19  Clovis Riley, MD  ?gabapentin (NEURONTIN) 100 MG capsule TAKE 1 CAPSULE BY MOUTH DAILY AT BEDTIME. 12/12/20 12/12/21  Levy Pupa, PA-C  ?gabapentin (NEURONTIN) 100 MG capsule Take 1 capsule every day by oral route at bedtime. 12/08/21   Levy Pupa, PA-C  ?hydrOXYzine (ATARAX/VISTARIL) 50 MG tablet Take 50 mg by mouth every 8 (eight) hours as  needed for anxiety.  09/06/18   [provider]  ?influenza vac split quadrivalent PF (FLUARIX) 0.5 ML injection Inject into the muscle. 09/25/21   Carlyle Basques, MD  ?losartan (COZAAR) 100 MG tablet Take 0.5 tablets (50 mg total) by mouth daily. 03/09/19   Clovis Riley, MD  ?nitrofurantoin, macrocrystal-monohydrate, (MACROBID) 100 MG capsule Take 1 capsule (100 mg total) by mouth 2 (two) times daily. 12/22/20   Verda Cumins, MD  ?ondansetron (ZOFRAN ODT) 4 MG disintegrating tablet Take 1 tablet (4 mg total) by mouth every 8 (eight) hours as needed for nausea or vomiting. 03/09/19   Clovis Riley, MD  ?oxyCODONE (ROXICODONE) 5 MG/5ML solution Take 5 mLs (5 mg total) by mouth every 6 (six) hours as needed for severe pain (please minimize narcotics). 03/09/19   Clovis Riley, MD  ?sertraline (ZOLOFT) 50 MG tablet Take 50 mg by mouth daily.     [provider]  ?sertraline (ZOLOFT) 50 MG tablet Take 1.5 tablets (75 mg total) by mouth nightly 01/08/22     ?traMADol (ULTRAM) 50 MG tablet TAKE 1 TABLET BY MOUTH EVERY 6 HOURS PRN 03/17/22     ?Vitamin D, Ergocalciferol, (DRISDOL) 1.25 MG (50000 UT) CAPS capsule Take 50,000 Units by mouth 2 (two) times a week.  04/23/18   [provider]  ? ? ?Family History ?Family History  ?Problem Relation Age of Onset  ? Heart disease Paternal Grandfather   ? Colon cancer Neg Hx   ? Esophageal cancer Neg Hx   ? Pancreatic cancer Neg Hx   ? Stomach cancer Neg Hx   ? Liver disease Neg Hx   ? Rectal cancer Neg Hx   ? Breast cancer Neg Hx   ? ? ?Social History ?Social History  ? ?Tobacco Use  ? Smoking status: Former  ?  Packs/day: 1.00  ?  Years: 27.00  ?  Pack years: 27.00  ?  Types: Cigarettes  ?  Quit date: 2011  ?  Years since quitting: 12.2  ? Smokeless tobacco: Never  ?Vaping Use  ? Vaping Use: Never used  ?Substance Use Topics  ? Alcohol use: No  ? Drug use: No  ? ? ? ?Allergies   ?Azathioprine, Bupropion, Lisinopril, Mycophenolate mofetil, Sulfa  antibiotics, Pantoprazole sodium, Singulair [montelukast], and Tramadol ? ? ?Review of Systems ?Review of Systems  ?Constitutional: Negative.   ?HENT:  Positive for congestion, rhinorrhea and sinus pressure. Negative for dental problem, drooling, ear discharge, ear pain, facial swelling, hearing loss, mouth sores, nosebleeds, postnasal drip, sinus pain, sneezing, sore throat, tinnitus, trouble swallowing and voice change.   ?Respiratory:  Positive for cough, shortness of breath and wheezing. Negative for apnea, choking, chest tightness and stridor.   ?Cardiovascular: Negative.   ?Gastrointestinal: Negative.   ?Neurological: Negative.   ? ? ?Physical Exam ?Triage Vital Signs ?ED Triage Vitals  ?Enc Vitals Group  ?   BP 03/23/22 0947 (!) 130/91  ?   Pulse Rate 03/23/22 0947 87  ?  Resp 03/23/22 0947 17  ?   Temp 03/23/22 0947 98.3 ?F (36.8 ?C)  ?   Temp Source 03/23/22 0947 Oral  ?   SpO2 03/23/22 0947 99 %  ?   Weight 03/23/22 0945 153 lb (69.4 kg)  ?   Height 03/23/22 0945 '5\' 7"'$  (1.702 m)  ?   Head Circumference --   ?   Peak Flow --   ?   Pain Score 03/23/22 0945 0  ?   Pain Loc --   ?   Pain Edu? --   ?   Excl. in [place]? --   ? ?No data found. ? ?Updated Vital Signs ?BP (!) 130/91 (BP Location: Left Arm)   Pulse 87   Temp 98.3 ?F (36.8 ?C) (Oral)   Resp 17   Ht '5\' 7"'$  (1.702 m)   Wt 153 lb (69.4 kg)   SpO2 99%   BMI 23.96 kg/m?  ? ?Visual Acuity ?Right Eye Distance:   ?Left Eye Distance:   ?Bilateral Distance:   ? ?Right Eye Near:   ?Left Eye Near:    ?Bilateral Near:    ? ?Physical Exam ?Constitutional:   ?   Appearance: Normal appearance.  ?HENT:  ?   Head: Normocephalic.  ?   Right Ear: Tympanic membrane, ear canal and external ear normal.  ?   Left Ear: Tympanic membrane, ear canal and external ear normal.  ?   Nose: Congestion present. No rhinorrhea.  ?   Mouth/Throat:  ?   Mouth: Mucous membranes are moist.  ?   Pharynx: Oropharynx is clear.  ?Eyes:  ?   Extraocular Movements: Extraocular movements  intact.  ?Cardiovascular:  ?   Rate and Rhythm: Normal rate and regular rhythm.  ?   Pulses: Normal pulses.  ?   Heart sounds: Normal heart sounds.  ?Pulmonary:  ?   Effort: Pulmonary effort is normal.  ?   Breath sounds

## 2022-03-23 NOTE — ED Triage Notes (Addendum)
Pt reports cough and nasal congestion x 8 days. States she has reactive airway disease and is normally prescribed prednisone to clear up infection. Also reports negative home covid test results.  ?

## 2022-03-23 NOTE — Discharge Instructions (Signed)
Begin use of prednisone every morning with food as directed on packaging, this medication is to help reduce your wheezing and inflammation which in turn will help with your shortness of breath ? ?You may use albuterol inhaler taking 2 puffs every 4 hours as needed for wheezing or shortness of breath ? ?Maintaining adequate hydration may help to thin secretions and soothe the respiratory mucosa  ? ?Warm Liquids- Ingestion of warm liquids may have a soothing effect on the respiratory mucosa, increase the flow of nasal mucus, and loosen respiratory secretions, making them easier to remove ? ?May try honey (2.5 to 5 mL [0.5 to 1 teaspoon]) can be given straight or diluted in liquid (juice). Corn syrup may be substituted if honey is not available.    ? ?You may continue use of over-the-counter medication that you find helpful with symptoms ? ?Please follow-up with urgent care primary doctor for persistent or worsening symptoms ? ?

## 2022-03-26 ENCOUNTER — Other Ambulatory Visit: Payer: Self-pay

## 2022-04-15 ENCOUNTER — Telehealth: Payer: Self-pay

## 2022-04-15 ENCOUNTER — Telehealth: Payer: Self-pay | Admitting: Gastroenterology

## 2022-04-15 NOTE — Telephone Encounter (Signed)
Colonoscopy has been rescheduled per patient request to 07/06/22.  Trish in Endo notified. ? ?New instructions will be provide via mychart. ? ?Thanks, ?Sharyn Lull, CMA ?

## 2022-04-15 NOTE — Telephone Encounter (Signed)
PT called to cancel colonoscopy and rescheu for a later date ?

## 2022-04-23 ENCOUNTER — Other Ambulatory Visit: Payer: Self-pay

## 2022-06-12 ENCOUNTER — Other Ambulatory Visit: Payer: Self-pay

## 2022-06-12 MED ORDER — TRAMADOL HCL 50 MG PO TABS
ORAL_TABLET | ORAL | 0 refills | Status: DC
  Filled 2022-06-12: qty 60, 15d supply, fill #0

## 2022-07-06 ENCOUNTER — Telehealth: Payer: Self-pay

## 2022-07-06 ENCOUNTER — Ambulatory Visit: Admission: RE | Admit: 2022-07-06 | Payer: 59 | Source: Home / Self Care | Admitting: Gastroenterology

## 2022-07-06 ENCOUNTER — Encounter: Admission: RE | Payer: Self-pay | Source: Home / Self Care

## 2022-07-06 SURGERY — COLONOSCOPY WITH PROPOFOL
Anesthesia: General

## 2022-07-06 NOTE — Telephone Encounter (Signed)
Patient called Ashley Paul this morning and Trish left me a voicemail saying that patient was our of state and forgot about her colonoscopy. Please call back to reschedule

## 2022-07-20 ENCOUNTER — Other Ambulatory Visit: Payer: Self-pay

## 2022-07-20 DIAGNOSIS — M5416 Radiculopathy, lumbar region: Secondary | ICD-10-CM | POA: Diagnosis not present

## 2022-07-20 MED ORDER — PREDNISONE 10 MG PO TABS
ORAL_TABLET | ORAL | 0 refills | Status: DC
  Filled 2022-07-20: qty 21, 6d supply, fill #0

## 2022-07-20 MED ORDER — HYDROCODONE-ACETAMINOPHEN 10-325 MG PO TABS
ORAL_TABLET | ORAL | 0 refills | Status: DC
  Filled 2022-07-20: qty 30, 7d supply, fill #0

## 2022-07-28 ENCOUNTER — Other Ambulatory Visit: Payer: Self-pay

## 2022-08-06 ENCOUNTER — Other Ambulatory Visit: Payer: Self-pay

## 2022-08-06 MED ORDER — GABAPENTIN 100 MG PO CAPS
ORAL_CAPSULE | ORAL | 2 refills | Status: DC
  Filled 2022-08-06: qty 30, 30d supply, fill #0
  Filled 2022-10-13: qty 30, 30d supply, fill #1
  Filled 2023-01-01: qty 30, 30d supply, fill #2

## 2022-08-06 MED ORDER — TRAMADOL HCL 50 MG PO TABS
ORAL_TABLET | ORAL | 0 refills | Status: DC
Start: 2022-08-06 — End: 2022-09-01
  Filled 2022-08-06: qty 60, 15d supply, fill #0

## 2022-09-01 ENCOUNTER — Other Ambulatory Visit: Payer: Self-pay

## 2022-09-01 MED ORDER — TRAMADOL HCL 50 MG PO TABS
ORAL_TABLET | ORAL | 0 refills | Status: DC
  Filled 2022-09-01: qty 60, 15d supply, fill #0

## 2022-09-11 ENCOUNTER — Encounter (HOSPITAL_COMMUNITY): Payer: Self-pay | Admitting: *Deleted

## 2022-09-15 ENCOUNTER — Other Ambulatory Visit: Payer: Self-pay

## 2022-09-15 MED ORDER — TRAMADOL HCL 50 MG PO TABS
ORAL_TABLET | ORAL | 0 refills | Status: DC
  Filled 2022-09-15: qty 60, 15d supply, fill #0

## 2022-09-16 ENCOUNTER — Other Ambulatory Visit: Payer: Self-pay

## 2022-09-16 MED ORDER — SERTRALINE HCL 50 MG PO TABS
ORAL_TABLET | ORAL | 0 refills | Status: DC
  Filled 2022-09-16: qty 90, 60d supply, fill #0

## 2022-09-18 ENCOUNTER — Other Ambulatory Visit: Payer: Self-pay

## 2022-09-18 DIAGNOSIS — Z1211 Encounter for screening for malignant neoplasm of colon: Secondary | ICD-10-CM | POA: Diagnosis not present

## 2022-09-18 DIAGNOSIS — R7303 Prediabetes: Secondary | ICD-10-CM | POA: Diagnosis not present

## 2022-09-18 DIAGNOSIS — E559 Vitamin D deficiency, unspecified: Secondary | ICD-10-CM | POA: Diagnosis not present

## 2022-09-18 DIAGNOSIS — E538 Deficiency of other specified B group vitamins: Secondary | ICD-10-CM | POA: Diagnosis not present

## 2022-09-18 DIAGNOSIS — R232 Flushing: Secondary | ICD-10-CM | POA: Diagnosis not present

## 2022-09-18 DIAGNOSIS — I10 Essential (primary) hypertension: Secondary | ICD-10-CM | POA: Diagnosis not present

## 2022-09-18 DIAGNOSIS — Z1231 Encounter for screening mammogram for malignant neoplasm of breast: Secondary | ICD-10-CM | POA: Diagnosis not present

## 2022-09-18 DIAGNOSIS — Z0000 Encounter for general adult medical examination without abnormal findings: Secondary | ICD-10-CM | POA: Diagnosis not present

## 2022-09-18 DIAGNOSIS — Z124 Encounter for screening for malignant neoplasm of cervix: Secondary | ICD-10-CM | POA: Diagnosis not present

## 2022-09-18 DIAGNOSIS — R3915 Urgency of urination: Secondary | ICD-10-CM | POA: Diagnosis not present

## 2022-09-18 DIAGNOSIS — Z1331 Encounter for screening for depression: Secondary | ICD-10-CM | POA: Diagnosis not present

## 2022-09-18 MED ORDER — CLOBETASOL PROPIONATE 0.05 % EX CREA
TOPICAL_CREAM | Freq: Two times a day (BID) | CUTANEOUS | 2 refills | Status: AC
  Filled 2022-09-18: qty 60, 30d supply, fill #0
  Filled 2023-01-12: qty 60, 30d supply, fill #1
  Filled 2023-02-13: qty 60, 30d supply, fill #2

## 2022-09-18 MED ORDER — SERTRALINE HCL 100 MG PO TABS
100.0000 mg | ORAL_TABLET | Freq: Every day | ORAL | 11 refills | Status: DC
  Filled 2022-09-18: qty 30, 30d supply, fill #0
  Filled 2022-12-22: qty 30, 30d supply, fill #1
  Filled 2023-01-01 – 2023-01-12 (×2): qty 30, 30d supply, fill #2
  Filled 2023-02-13: qty 30, 30d supply, fill #3
  Filled 2023-04-01: qty 30, 30d supply, fill #4
  Filled 2023-05-11: qty 30, 30d supply, fill #5

## 2022-09-18 MED ORDER — CLOBETASOL PROPIONATE 0.05 % EX SHAM
MEDICATED_SHAMPOO | CUTANEOUS | 3 refills | Status: AC
  Filled 2022-09-18: qty 118, 30d supply, fill #0
  Filled 2023-01-12: qty 118, 30d supply, fill #1
  Filled 2023-02-13: qty 118, 30d supply, fill #2
  Filled 2023-09-03: qty 118, 30d supply, fill #3

## 2022-09-30 ENCOUNTER — Other Ambulatory Visit: Payer: Self-pay

## 2022-09-30 MED ORDER — TRAMADOL HCL 50 MG PO TABS
50.0000 mg | ORAL_TABLET | Freq: Four times a day (QID) | ORAL | 0 refills | Status: DC | PRN
  Filled 2022-09-30: qty 60, 15d supply, fill #0

## 2022-10-13 ENCOUNTER — Other Ambulatory Visit: Payer: Self-pay

## 2022-10-13 MED ORDER — TRAMADOL HCL 50 MG PO TABS
50.0000 mg | ORAL_TABLET | Freq: Four times a day (QID) | ORAL | 0 refills | Status: DC | PRN
  Filled 2022-10-13 – 2022-10-14 (×2): qty 60, 15d supply, fill #0

## 2022-10-14 ENCOUNTER — Other Ambulatory Visit: Payer: Self-pay

## 2022-10-16 DIAGNOSIS — H43813 Vitreous degeneration, bilateral: Secondary | ICD-10-CM | POA: Diagnosis not present

## 2022-10-27 ENCOUNTER — Other Ambulatory Visit: Payer: Self-pay

## 2022-10-27 MED ORDER — TRAMADOL HCL 50 MG PO TABS
ORAL_TABLET | ORAL | 0 refills | Status: DC
  Filled 2022-10-27: qty 60, 15d supply, fill #0

## 2022-10-29 ENCOUNTER — Other Ambulatory Visit: Payer: Self-pay

## 2022-11-04 ENCOUNTER — Other Ambulatory Visit: Payer: Self-pay

## 2022-11-06 ENCOUNTER — Other Ambulatory Visit: Payer: Self-pay

## 2022-11-06 MED ORDER — TRAMADOL HCL 50 MG PO TABS
50.0000 mg | ORAL_TABLET | Freq: Four times a day (QID) | ORAL | 0 refills | Status: DC | PRN
  Filled 2022-11-09: qty 60, 15d supply, fill #0

## 2022-11-09 ENCOUNTER — Other Ambulatory Visit: Payer: Self-pay

## 2022-11-24 ENCOUNTER — Other Ambulatory Visit: Payer: Self-pay

## 2022-11-24 MED ORDER — TRAMADOL HCL 50 MG PO TABS
ORAL_TABLET | ORAL | 0 refills | Status: DC
  Filled 2022-11-24: qty 60, 15d supply, fill #0

## 2022-11-26 DIAGNOSIS — B028 Zoster with other complications: Secondary | ICD-10-CM | POA: Diagnosis not present

## 2022-11-29 ENCOUNTER — Other Ambulatory Visit: Payer: Self-pay

## 2022-11-30 ENCOUNTER — Other Ambulatory Visit: Payer: Self-pay

## 2022-11-30 MED ORDER — TRAMADOL HCL 50 MG PO TABS
50.0000 mg | ORAL_TABLET | Freq: Four times a day (QID) | ORAL | 0 refills | Status: DC | PRN
  Filled 2022-11-30: qty 60, 15d supply, fill #0

## 2022-12-01 ENCOUNTER — Other Ambulatory Visit: Payer: Self-pay

## 2022-12-07 ENCOUNTER — Other Ambulatory Visit: Payer: Self-pay

## 2022-12-11 ENCOUNTER — Other Ambulatory Visit: Payer: Self-pay

## 2022-12-11 MED ORDER — TRAMADOL HCL 50 MG PO TABS
50.0000 mg | ORAL_TABLET | Freq: Four times a day (QID) | ORAL | 0 refills | Status: DC | PRN
  Filled 2022-12-11 – 2022-12-22 (×6): qty 120, 30d supply, fill #0

## 2022-12-14 ENCOUNTER — Other Ambulatory Visit: Payer: Self-pay

## 2022-12-17 ENCOUNTER — Other Ambulatory Visit: Payer: Self-pay

## 2022-12-18 ENCOUNTER — Other Ambulatory Visit: Payer: Self-pay

## 2022-12-22 ENCOUNTER — Other Ambulatory Visit: Payer: Self-pay

## 2023-01-01 ENCOUNTER — Other Ambulatory Visit: Payer: Self-pay

## 2023-01-01 DIAGNOSIS — M545 Low back pain, unspecified: Secondary | ICD-10-CM | POA: Diagnosis not present

## 2023-01-01 MED ORDER — HYDROCODONE-ACETAMINOPHEN 10-325 MG PO TABS
1.0000 | ORAL_TABLET | Freq: Three times a day (TID) | ORAL | 0 refills | Status: DC | PRN
  Filled 2023-01-01: qty 60, 20d supply, fill #0

## 2023-01-06 DIAGNOSIS — J9801 Acute bronchospasm: Secondary | ICD-10-CM | POA: Diagnosis not present

## 2023-01-06 DIAGNOSIS — J988 Other specified respiratory disorders: Secondary | ICD-10-CM | POA: Diagnosis not present

## 2023-01-12 ENCOUNTER — Other Ambulatory Visit: Payer: Self-pay

## 2023-01-12 MED ORDER — HYDROCODONE-ACETAMINOPHEN 10-325 MG PO TABS
1.0000 | ORAL_TABLET | Freq: Three times a day (TID) | ORAL | 0 refills | Status: DC | PRN
  Filled 2023-01-12 – 2023-01-28 (×2): qty 60, fill #0
  Filled 2023-02-01: qty 60, 20d supply, fill #0

## 2023-01-13 ENCOUNTER — Other Ambulatory Visit: Payer: Self-pay

## 2023-01-13 MED ORDER — HYDROCODONE-ACETAMINOPHEN 10-325 MG PO TABS
1.0000 | ORAL_TABLET | Freq: Three times a day (TID) | ORAL | 0 refills | Status: AC | PRN
  Filled 2023-01-13: qty 60, 20d supply, fill #0

## 2023-01-14 ENCOUNTER — Other Ambulatory Visit: Payer: Self-pay

## 2023-01-15 ENCOUNTER — Other Ambulatory Visit: Payer: Self-pay

## 2023-01-28 ENCOUNTER — Other Ambulatory Visit: Payer: Self-pay

## 2023-02-01 ENCOUNTER — Other Ambulatory Visit: Payer: Self-pay

## 2023-02-13 ENCOUNTER — Other Ambulatory Visit: Payer: Self-pay

## 2023-02-14 ENCOUNTER — Other Ambulatory Visit: Payer: Self-pay

## 2023-02-14 MED ORDER — GABAPENTIN 100 MG PO CAPS
100.0000 mg | ORAL_CAPSULE | Freq: Every day | ORAL | 2 refills | Status: DC
  Filled 2023-02-14: qty 30, 30d supply, fill #0
  Filled 2023-04-21 – 2023-06-07 (×2): qty 30, 30d supply, fill #1

## 2023-02-15 ENCOUNTER — Other Ambulatory Visit: Payer: Self-pay

## 2023-02-16 ENCOUNTER — Other Ambulatory Visit: Payer: Self-pay

## 2023-02-16 MED ORDER — HYDROCODONE-ACETAMINOPHEN 10-325 MG PO TABS
1.0000 | ORAL_TABLET | Freq: Three times a day (TID) | ORAL | 0 refills | Status: DC | PRN
  Filled 2023-02-16 – 2023-02-19 (×2): qty 60, 20d supply, fill #0

## 2023-02-19 ENCOUNTER — Other Ambulatory Visit: Payer: Self-pay

## 2023-03-06 ENCOUNTER — Other Ambulatory Visit: Payer: Self-pay

## 2023-03-06 MED ORDER — HYDROCODONE-ACETAMINOPHEN 10-325 MG PO TABS
1.0000 | ORAL_TABLET | Freq: Three times a day (TID) | ORAL | 0 refills | Status: DC | PRN
  Filled 2023-03-06 – 2023-03-09 (×2): qty 60, 20d supply, fill #0

## 2023-03-07 ENCOUNTER — Other Ambulatory Visit: Payer: Self-pay

## 2023-03-09 ENCOUNTER — Other Ambulatory Visit: Payer: Self-pay

## 2023-03-22 ENCOUNTER — Other Ambulatory Visit: Payer: Self-pay

## 2023-03-22 MED ORDER — TRAMADOL HCL 50 MG PO TABS
50.0000 mg | ORAL_TABLET | Freq: Four times a day (QID) | ORAL | 0 refills | Status: DC | PRN
  Filled 2023-03-22: qty 120, 30d supply, fill #0

## 2023-03-29 ENCOUNTER — Other Ambulatory Visit: Payer: Self-pay

## 2023-03-31 ENCOUNTER — Other Ambulatory Visit: Payer: Self-pay

## 2023-04-01 ENCOUNTER — Other Ambulatory Visit: Payer: Self-pay

## 2023-04-01 MED ORDER — HYDROCODONE-ACETAMINOPHEN 10-325 MG PO TABS
1.0000 | ORAL_TABLET | Freq: Three times a day (TID) | ORAL | 0 refills | Status: DC | PRN
  Filled 2023-04-01: qty 60, 20d supply, fill #0

## 2023-04-05 ENCOUNTER — Other Ambulatory Visit: Payer: Self-pay

## 2023-04-12 ENCOUNTER — Other Ambulatory Visit: Payer: Self-pay

## 2023-04-13 ENCOUNTER — Other Ambulatory Visit: Payer: Self-pay

## 2023-04-13 MED ORDER — TRAMADOL HCL 50 MG PO TABS
50.0000 mg | ORAL_TABLET | Freq: Four times a day (QID) | ORAL | 0 refills | Status: DC | PRN
  Filled 2023-04-13 – 2023-05-13 (×6): qty 120, 30d supply, fill #0

## 2023-04-15 ENCOUNTER — Other Ambulatory Visit: Payer: Self-pay

## 2023-04-16 ENCOUNTER — Other Ambulatory Visit: Payer: Self-pay

## 2023-04-18 ENCOUNTER — Other Ambulatory Visit: Payer: Self-pay

## 2023-04-19 ENCOUNTER — Other Ambulatory Visit: Payer: Self-pay

## 2023-04-20 ENCOUNTER — Other Ambulatory Visit: Payer: Self-pay

## 2023-04-21 ENCOUNTER — Other Ambulatory Visit: Payer: Self-pay

## 2023-04-22 ENCOUNTER — Other Ambulatory Visit: Payer: Self-pay

## 2023-04-26 ENCOUNTER — Other Ambulatory Visit: Payer: Self-pay

## 2023-04-30 ENCOUNTER — Other Ambulatory Visit: Payer: Self-pay

## 2023-05-04 ENCOUNTER — Other Ambulatory Visit: Payer: Self-pay

## 2023-05-05 ENCOUNTER — Other Ambulatory Visit: Payer: Self-pay

## 2023-05-06 ENCOUNTER — Other Ambulatory Visit: Payer: Self-pay

## 2023-05-06 MED ORDER — HYDROCODONE-ACETAMINOPHEN 10-325 MG PO TABS
1.0000 | ORAL_TABLET | Freq: Three times a day (TID) | ORAL | 0 refills | Status: DC | PRN
  Filled 2023-05-06: qty 60, 20d supply, fill #0

## 2023-05-07 ENCOUNTER — Other Ambulatory Visit: Payer: Self-pay

## 2023-05-11 ENCOUNTER — Other Ambulatory Visit: Payer: Self-pay

## 2023-05-13 ENCOUNTER — Other Ambulatory Visit: Payer: Self-pay

## 2023-05-23 ENCOUNTER — Other Ambulatory Visit: Payer: Self-pay

## 2023-05-24 ENCOUNTER — Other Ambulatory Visit: Payer: Self-pay

## 2023-05-25 ENCOUNTER — Other Ambulatory Visit: Payer: Self-pay

## 2023-05-25 DIAGNOSIS — F419 Anxiety disorder, unspecified: Secondary | ICD-10-CM | POA: Diagnosis not present

## 2023-05-25 DIAGNOSIS — Z1330 Encounter for screening examination for mental health and behavioral disorders, unspecified: Secondary | ICD-10-CM | POA: Diagnosis not present

## 2023-05-25 MED ORDER — CLONAZEPAM 0.5 MG PO TABS
0.5000 mg | ORAL_TABLET | Freq: Every evening | ORAL | 0 refills | Status: DC | PRN
  Filled 2023-05-25: qty 10, 10d supply, fill #0

## 2023-05-25 MED ORDER — SERTRALINE HCL 100 MG PO TABS
150.0000 mg | ORAL_TABLET | Freq: Every day | ORAL | 11 refills | Status: AC
  Filled 2023-05-25 – 2023-06-04 (×2): qty 45, 30d supply, fill #0
  Filled 2023-07-07 – 2023-07-08 (×2): qty 45, 30d supply, fill #1
  Filled 2023-08-02: qty 45, 30d supply, fill #2
  Filled 2023-09-03: qty 45, 30d supply, fill #3
  Filled 2023-11-11: qty 45, 30d supply, fill #4
  Filled 2023-12-27: qty 45, 30d supply, fill #5
  Filled 2024-01-31 – 2024-02-11 (×2): qty 45, 30d supply, fill #6
  Filled 2024-03-19: qty 45, 30d supply, fill #7
  Filled 2024-04-18: qty 45, 30d supply, fill #8

## 2023-05-27 ENCOUNTER — Other Ambulatory Visit: Payer: Self-pay

## 2023-05-27 MED ORDER — BUSPIRONE HCL 5 MG PO TABS
5.0000 mg | ORAL_TABLET | Freq: Two times a day (BID) | ORAL | 8 refills | Status: AC
  Filled 2023-05-27: qty 60, 30d supply, fill #0
  Filled 2023-07-07 – 2023-07-08 (×2): qty 60, 30d supply, fill #1
  Filled 2023-08-02: qty 60, 30d supply, fill #2
  Filled 2023-09-03: qty 60, 30d supply, fill #3
  Filled 2023-12-27: qty 60, 30d supply, fill #4
  Filled 2024-04-16: qty 60, 30d supply, fill #5

## 2023-06-04 ENCOUNTER — Other Ambulatory Visit: Payer: Self-pay

## 2023-06-05 ENCOUNTER — Other Ambulatory Visit: Payer: Self-pay

## 2023-06-06 ENCOUNTER — Other Ambulatory Visit: Payer: Self-pay

## 2023-06-07 ENCOUNTER — Other Ambulatory Visit: Payer: Self-pay

## 2023-06-08 ENCOUNTER — Other Ambulatory Visit: Payer: Self-pay

## 2023-06-10 ENCOUNTER — Other Ambulatory Visit: Payer: Self-pay

## 2023-06-11 ENCOUNTER — Other Ambulatory Visit: Payer: Self-pay

## 2023-06-17 ENCOUNTER — Other Ambulatory Visit: Payer: Self-pay

## 2023-06-18 ENCOUNTER — Other Ambulatory Visit: Payer: Self-pay

## 2023-06-21 ENCOUNTER — Other Ambulatory Visit: Payer: Self-pay

## 2023-06-22 ENCOUNTER — Other Ambulatory Visit: Payer: Self-pay

## 2023-06-24 ENCOUNTER — Other Ambulatory Visit: Payer: Self-pay

## 2023-07-07 ENCOUNTER — Other Ambulatory Visit: Payer: Self-pay

## 2023-07-08 ENCOUNTER — Other Ambulatory Visit: Payer: Self-pay

## 2023-07-14 ENCOUNTER — Other Ambulatory Visit: Payer: Self-pay

## 2023-07-14 MED ORDER — CLONAZEPAM 0.5 MG PO TABS
0.5000 mg | ORAL_TABLET | Freq: Every evening | ORAL | 0 refills | Status: DC | PRN
  Filled 2023-07-14: qty 10, 30d supply, fill #0

## 2023-08-02 ENCOUNTER — Other Ambulatory Visit: Payer: Self-pay

## 2023-08-03 ENCOUNTER — Other Ambulatory Visit: Payer: Self-pay

## 2023-08-05 ENCOUNTER — Other Ambulatory Visit: Payer: Self-pay

## 2023-08-06 ENCOUNTER — Other Ambulatory Visit: Payer: Self-pay

## 2023-08-08 ENCOUNTER — Other Ambulatory Visit: Payer: Self-pay

## 2023-08-10 ENCOUNTER — Other Ambulatory Visit: Payer: Self-pay

## 2023-08-10 DIAGNOSIS — M5416 Radiculopathy, lumbar region: Secondary | ICD-10-CM | POA: Diagnosis not present

## 2023-08-10 DIAGNOSIS — M549 Dorsalgia, unspecified: Secondary | ICD-10-CM | POA: Diagnosis not present

## 2023-09-03 ENCOUNTER — Other Ambulatory Visit: Payer: Self-pay

## 2023-09-05 ENCOUNTER — Other Ambulatory Visit: Payer: Self-pay

## 2023-09-14 ENCOUNTER — Other Ambulatory Visit: Payer: Self-pay

## 2023-09-30 ENCOUNTER — Encounter (HOSPITAL_COMMUNITY): Payer: Self-pay | Admitting: *Deleted

## 2023-11-11 ENCOUNTER — Other Ambulatory Visit: Payer: Self-pay

## 2023-11-12 ENCOUNTER — Other Ambulatory Visit: Payer: Self-pay

## 2023-11-17 ENCOUNTER — Other Ambulatory Visit: Payer: Self-pay

## 2023-11-18 ENCOUNTER — Other Ambulatory Visit: Payer: Self-pay

## 2023-11-24 ENCOUNTER — Other Ambulatory Visit: Payer: Self-pay

## 2023-12-27 ENCOUNTER — Other Ambulatory Visit (HOSPITAL_COMMUNITY): Payer: Self-pay

## 2023-12-27 MED ORDER — HYDROCODONE-ACETAMINOPHEN 10-325 MG PO TABS
1.0000 | ORAL_TABLET | Freq: Two times a day (BID) | ORAL | 0 refills | Status: DC | PRN
  Filled 2023-12-27: qty 60, 30d supply, fill #0

## 2023-12-30 DIAGNOSIS — M5416 Radiculopathy, lumbar region: Secondary | ICD-10-CM | POA: Diagnosis not present

## 2023-12-30 DIAGNOSIS — Z79899 Other long term (current) drug therapy: Secondary | ICD-10-CM | POA: Diagnosis not present

## 2023-12-30 DIAGNOSIS — Z5181 Encounter for therapeutic drug level monitoring: Secondary | ICD-10-CM | POA: Diagnosis not present

## 2024-01-10 ENCOUNTER — Other Ambulatory Visit (HOSPITAL_COMMUNITY): Payer: Self-pay

## 2024-01-10 MED ORDER — TRAMADOL HCL 50 MG PO TABS
50.0000 mg | ORAL_TABLET | Freq: Four times a day (QID) | ORAL | 0 refills | Status: DC | PRN
  Filled 2024-01-10: qty 60, 15d supply, fill #0

## 2024-01-13 ENCOUNTER — Other Ambulatory Visit (HOSPITAL_COMMUNITY): Payer: Self-pay

## 2024-01-13 MED ORDER — HYDROCODONE-ACETAMINOPHEN 10-325 MG PO TABS
1.0000 | ORAL_TABLET | Freq: Two times a day (BID) | ORAL | 0 refills | Status: DC | PRN
  Filled 2024-01-13 – 2024-02-11 (×2): qty 60, 30d supply, fill #0

## 2024-01-13 MED ORDER — HYDROCODONE-ACETAMINOPHEN 10-325 MG PO TABS
1.0000 | ORAL_TABLET | Freq: Two times a day (BID) | ORAL | 0 refills | Status: DC | PRN
  Filled 2024-01-13 (×2): qty 60, 30d supply, fill #0

## 2024-01-23 ENCOUNTER — Other Ambulatory Visit (HOSPITAL_COMMUNITY): Payer: Self-pay

## 2024-01-24 ENCOUNTER — Other Ambulatory Visit (HOSPITAL_COMMUNITY): Payer: Self-pay

## 2024-01-24 MED ORDER — TRAMADOL HCL 50 MG PO TABS
50.0000 mg | ORAL_TABLET | Freq: Four times a day (QID) | ORAL | 0 refills | Status: DC | PRN
  Filled 2024-01-24: qty 60, 15d supply, fill #0

## 2024-01-29 ENCOUNTER — Other Ambulatory Visit (HOSPITAL_COMMUNITY): Payer: Self-pay

## 2024-01-31 ENCOUNTER — Other Ambulatory Visit: Payer: Self-pay

## 2024-01-31 ENCOUNTER — Encounter (HOSPITAL_COMMUNITY): Payer: Self-pay

## 2024-01-31 ENCOUNTER — Other Ambulatory Visit (HOSPITAL_COMMUNITY): Payer: Self-pay

## 2024-01-31 MED ORDER — HYDROCODONE-ACETAMINOPHEN 10-325 MG PO TABS
1.0000 | ORAL_TABLET | Freq: Two times a day (BID) | ORAL | 0 refills | Status: DC | PRN
  Filled 2024-01-31 – 2024-02-24 (×3): qty 60, 30d supply, fill #0

## 2024-02-03 ENCOUNTER — Other Ambulatory Visit (HOSPITAL_COMMUNITY): Payer: Self-pay

## 2024-02-03 ENCOUNTER — Encounter (HOSPITAL_COMMUNITY): Payer: Self-pay

## 2024-02-03 MED ORDER — TRAMADOL HCL 50 MG PO TABS
50.0000 mg | ORAL_TABLET | Freq: Four times a day (QID) | ORAL | 0 refills | Status: DC | PRN
  Filled 2024-02-07 (×2): qty 60, 15d supply, fill #0

## 2024-02-06 ENCOUNTER — Encounter (HOSPITAL_COMMUNITY): Payer: Self-pay

## 2024-02-07 ENCOUNTER — Other Ambulatory Visit (HOSPITAL_COMMUNITY): Payer: Self-pay

## 2024-02-07 ENCOUNTER — Encounter (HOSPITAL_COMMUNITY): Payer: Self-pay

## 2024-02-07 ENCOUNTER — Other Ambulatory Visit: Payer: Self-pay

## 2024-02-10 ENCOUNTER — Other Ambulatory Visit (HOSPITAL_COMMUNITY): Payer: Self-pay

## 2024-02-11 ENCOUNTER — Other Ambulatory Visit (HOSPITAL_COMMUNITY): Payer: Self-pay

## 2024-02-11 ENCOUNTER — Encounter (HOSPITAL_COMMUNITY): Payer: Self-pay

## 2024-02-16 ENCOUNTER — Other Ambulatory Visit (HOSPITAL_COMMUNITY): Payer: Self-pay

## 2024-02-16 ENCOUNTER — Other Ambulatory Visit: Payer: Self-pay

## 2024-02-16 ENCOUNTER — Encounter (HOSPITAL_COMMUNITY): Payer: Self-pay

## 2024-02-16 MED ORDER — TRAMADOL HCL 50 MG PO TABS
50.0000 mg | ORAL_TABLET | Freq: Four times a day (QID) | ORAL | 0 refills | Status: DC | PRN
  Filled 2024-02-16 – 2024-02-21 (×4): qty 60, 15d supply, fill #0

## 2024-02-21 ENCOUNTER — Other Ambulatory Visit (HOSPITAL_COMMUNITY): Payer: Self-pay

## 2024-02-24 ENCOUNTER — Other Ambulatory Visit: Payer: Self-pay

## 2024-02-28 ENCOUNTER — Other Ambulatory Visit (HOSPITAL_COMMUNITY): Payer: Self-pay

## 2024-02-29 ENCOUNTER — Other Ambulatory Visit (HOSPITAL_COMMUNITY): Payer: Self-pay

## 2024-02-29 MED ORDER — TRAMADOL HCL 50 MG PO TABS
50.0000 mg | ORAL_TABLET | Freq: Four times a day (QID) | ORAL | 0 refills | Status: DC | PRN
Start: 2024-02-29 — End: 2024-03-16
  Filled 2024-02-29 – 2024-03-06 (×3): qty 60, 15d supply, fill #0

## 2024-03-02 ENCOUNTER — Other Ambulatory Visit (HOSPITAL_COMMUNITY): Payer: Self-pay

## 2024-03-02 ENCOUNTER — Encounter (HOSPITAL_COMMUNITY): Payer: Self-pay

## 2024-03-02 MED ORDER — TRAMADOL HCL 50 MG PO TABS
50.0000 mg | ORAL_TABLET | Freq: Four times a day (QID) | ORAL | 0 refills | Status: DC | PRN
  Filled 2024-03-02: qty 60, 15d supply, fill #0

## 2024-03-06 ENCOUNTER — Other Ambulatory Visit (HOSPITAL_COMMUNITY): Payer: Self-pay

## 2024-03-06 ENCOUNTER — Other Ambulatory Visit: Payer: Self-pay

## 2024-03-09 ENCOUNTER — Other Ambulatory Visit (HOSPITAL_COMMUNITY): Payer: Self-pay

## 2024-03-09 MED ORDER — HYDROCODONE-ACETAMINOPHEN 10-325 MG PO TABS
1.0000 | ORAL_TABLET | Freq: Two times a day (BID) | ORAL | 0 refills | Status: DC | PRN
  Filled 2024-03-10: qty 60, 30d supply, fill #0

## 2024-03-10 ENCOUNTER — Other Ambulatory Visit: Payer: Self-pay

## 2024-03-10 ENCOUNTER — Encounter (HOSPITAL_COMMUNITY): Payer: Self-pay

## 2024-03-10 ENCOUNTER — Other Ambulatory Visit (HOSPITAL_COMMUNITY): Payer: Self-pay

## 2024-03-10 ENCOUNTER — Emergency Department (HOSPITAL_COMMUNITY)
Admission: EM | Admit: 2024-03-10 | Discharge: 2024-03-10 | Disposition: A | Discharge status: Home / Self Care | Source: Home / Self Care | Attending: Emergency Medicine | Admitting: Emergency Medicine

## 2024-03-10 DIAGNOSIS — I10 Essential (primary) hypertension: Secondary | ICD-10-CM | POA: Insufficient documentation

## 2024-03-10 DIAGNOSIS — K529 Noninfective gastroenteritis and colitis, unspecified: Secondary | ICD-10-CM | POA: Insufficient documentation

## 2024-03-10 DIAGNOSIS — K561 Intussusception: Secondary | ICD-10-CM | POA: Diagnosis not present

## 2024-03-10 LAB — URINALYSIS, ROUTINE W REFLEX MICROSCOPIC
Bilirubin Urine: NEGATIVE
Glucose, UA: NEGATIVE mg/dL
Hgb urine dipstick: NEGATIVE
Ketones, ur: 40 mg/dL — AB
Leukocytes,Ua: NEGATIVE
Nitrite: NEGATIVE
Protein, ur: NEGATIVE mg/dL
Specific Gravity, Urine: >1.03 — ABNORMAL HIGH (ref 1.005–1.030)
pH: 6 (ref 5.0–8.0)

## 2024-03-10 LAB — RESP PANEL BY RT-PCR (RSV, FLU A&B, COVID)  RVPGX2
Influenza A by PCR: NEGATIVE
Influenza B by PCR: NEGATIVE
Resp Syncytial Virus by PCR: NEGATIVE
SARS Coronavirus 2 by RT PCR: NEGATIVE

## 2024-03-10 LAB — CBC
HCT: 38.2 % (ref 36.0–46.0)
Hemoglobin: 12.4 g/dL (ref 12.0–15.0)
MCH: 27.9 pg (ref 26.0–34.0)
MCHC: 32.5 g/dL (ref 30.0–36.0)
MCV: 86 fL (ref 80.0–100.0)
Platelets: 267 10*3/uL (ref 150–400)
RBC: 4.44 MIL/uL (ref 3.87–5.11)
RDW: 13.2 % (ref 11.5–15.5)
WBC: 6.7 10*3/uL (ref 4.0–10.5)
nRBC: 0 % (ref 0.0–0.2)

## 2024-03-10 LAB — COMPREHENSIVE METABOLIC PANEL
ALT: 13 U/L (ref 0–44)
AST: 20 U/L (ref 15–41)
Albumin: 3.9 g/dL (ref 3.5–5.0)
Alkaline Phosphatase: 71 U/L (ref 38–126)
Anion gap: 7 (ref 5–15)
BUN: 13 mg/dL (ref 6–20)
CO2: 25 mmol/L (ref 22–32)
Calcium: 8.7 mg/dL — ABNORMAL LOW (ref 8.9–10.3)
Chloride: 106 mmol/L (ref 98–111)
Creatinine, Ser: 0.74 mg/dL (ref 0.44–1.00)
GFR, Estimated: >60 mL/min (ref >60)
Glucose, Bld: 84 mg/dL (ref 70–99)
Potassium: 3.5 mmol/L (ref 3.5–5.1)
Sodium: 138 mmol/L (ref 135–145)
Total Bilirubin: 0.5 mg/dL (ref 0.0–1.2)
Total Protein: 6.8 g/dL (ref 6.5–8.1)

## 2024-03-10 LAB — C DIFFICILE QUICK SCREEN W PCR REFLEX
C Diff antigen: NEGATIVE
C Diff interpretation: NOT DETECTED
C Diff toxin: NEGATIVE

## 2024-03-10 LAB — HCG, SERUM, QUALITATIVE: Preg, Serum: NEGATIVE

## 2024-03-10 LAB — LIPASE, BLOOD: Lipase: 29 U/L (ref 11–51)

## 2024-03-10 MED ORDER — LOPERAMIDE HCL 2 MG PO CAPS: 2.0000 mg | ORAL_CAPSULE | Freq: Four times a day (QID) | ORAL | 0 refills | Status: DC | PRN

## 2024-03-10 MED ORDER — LOPERAMIDE HCL 2 MG PO CAPS: 4.0000 mg | ORAL_CAPSULE | ORAL | Status: DC | PRN

## 2024-03-10 MED ORDER — SODIUM CHLORIDE 0.9 % IV BOLUS
1000.0000 mL | Freq: Once | INTRAVENOUS | Status: AC
  Administered 2024-03-10: 1000 mL via INTRAVENOUS

## 2024-03-10 MED ORDER — ONDANSETRON 4 MG PO TBDP
4.0000 mg | ORAL_TABLET | Freq: Once | ORAL | Status: AC | PRN
  Administered 2024-03-10: 4 mg via ORAL
  Filled 2024-03-10: qty 1

## 2024-03-10 MED ORDER — ONDANSETRON 4 MG PO TBDP: 4.0000 mg | ORAL_TABLET | Freq: Three times a day (TID) | ORAL | 0 refills | Status: DC | PRN

## 2024-03-10 NOTE — ED Provider Notes (Signed)
 [name] [facility] AT [facility] Provider Note   CSN: [id number] Arrival date & time: 03/10/24  1020     History  Chief Complaint  Patient presents with   Diarrhea    Ashley Paul is a 53 y.o. female with past medical history of hypertension, anxiety, depression, obesity, GERD presenting to emergency room with complaint of nausea vomiting diarrhea.  Symptoms been ongoing for 10 days.  Patient reports she has had 6-10 episodes of brown loose stool.  Reports she has had 6 loose stool today.  Denies any vomiting today.  She has not noted any blood in stool or vomit.  Denies any significant abdominal tenderness.  She has tried Imodium once. 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 No recent travel. No recent antibiotics. No suspicious food intake. No prior abdominal surgeries.    Diarrhea      Home Medications Prior to Admission medications   Medication Sig Start Date End Date Taking? Authorizing Provider  acetaminophen (TYLENOL) 325 MG tablet Take 650 mg by mouth every 6 (six) hours as needed for headache (pain).    [provider]  albuterol (VENTOLIN HFA) 108 (90 Base) MCG/ACT inhaler Inhale 2 puffs into the lungs every 4 (four) hours as needed for wheezing or shortness of breath. 03/23/22   White, Elita Boone, NP  beclomethasone (QVAR REDIHALER) 80 MCG/ACT inhaler Inhale 1 puff into the lungs 2 (two) times daily. 12/07/18   Janalyn Harder, PA-C  busPIRone (BUSPAR) 5 MG tablet Take 1 tablet (5 mg total) by mouth 2 (two) times daily 05/27/23     clobetasol cream (TEMOVATE) 0.05 % Apply topically 2 (two) times daily 09/18/22     Clobetasol Propionate 0.05 % shampoo Apply thin film to dry scalp leave in place for 15 minutes, then add water and lather.  Rinse thoroughly. Use once daily 09/18/22     clonazePAM (KLONOPIN) 0.5 MG tablet Take 1 tablet (0.5 mg total) by mouth at bedtime as needed for anxiety. 05/25/23     clonazePAM  (KLONOPIN) 0.5 MG tablet Take 1 tablet (0.5 mg total) by mouth at bedtime as needed for anxiety. 30 day supply 07/13/23     esomeprazole (NEXIUM) 20 MG packet Take 20 mg by mouth daily before breakfast. 03/09/19   Berna Bue, MD  gabapentin (NEURONTIN) 100 MG capsule Take 2 capsules (200 mg total) by mouth every 12 (twelve) hours for 7 days. 03/09/19 03/16/19  Berna Bue, MD  gabapentin (NEURONTIN) 100 MG capsule TAKE 1 CAPSULE BY MOUTH DAILY AT BEDTIME. 12/12/20 12/12/21  Su Hoff, PA-C  gabapentin (NEURONTIN) 100 MG capsule Take 1 capsule every day by oral route at bedtime. 12/08/21   Su Hoff, PA-C  gabapentin (NEURONTIN) 100 MG capsule Take 1 capsule (100 mg total) by mouth at bedtime. 02/14/23     HYDROcodone-acetaminophen (NORCO) 10-325 MG tablet Take 1 tablet by mouth 3 (three) times daily as needed. 01/12/23     HYDROcodone-acetaminophen (NORCO) 10-325 MG tablet Take 1 tablet by mouth 3 (three) times daily as needed. 01/13/23     HYDROcodone-acetaminophen (NORCO) 10-325 MG tablet Take 1 tablet by mouth 2 (two) times daily as needed. 01/31/24     HYDROcodone-acetaminophen (NORCO) 10-325 MG tablet Take 1 tablet by mouth 2 (two) times daily as needed. 03/09/24     hydrOXYzine (ATARAX/VISTARIL) 50 MG tablet Take 50 mg by mouth every 8 (eight) hours as needed for anxiety.  09/06/18   [provider]  influenza vac split quadrivalent PF (FLUARIX) 0.5 ML injection Inject into the muscle. 09/25/21   Judyann Munson, MD  losartan (COZAAR) 100 MG tablet Take 0.5 tablets (50 mg total) by mouth daily. 03/09/19   Berna Bue, MD  nitrofurantoin, macrocrystal-monohydrate, (MACROBID) 100 MG capsule Take 1 capsule (100 mg total) by mouth 2 (two) times daily. 12/22/20   Delton See, MD  ondansetron (ZOFRAN ODT) 4 MG disintegrating tablet Take 1 tablet (4 mg total) by mouth every 8 (eight) hours as needed for nausea or vomiting. 03/09/19   Berna Bue, MD  oxyCODONE  (ROXICODONE) 5 MG/5ML solution Take 5 mLs (5 mg total) by mouth every 6 (six) hours as needed for severe pain (please minimize narcotics). 03/09/19   Berna Bue, MD  predniSONE (DELTASONE) 10 MG tablet Take by mouth daily. 03/23/22   Valinda Hoar, NP  sertraline (ZOLOFT) 100 MG tablet Take 1 tablet (100 mg total) by mouth at bedtime. 09/18/22     sertraline (ZOLOFT) 100 MG tablet Take 1.5 tablets (150 mg total) by mouth at bedtime. 05/25/23     sertraline (ZOLOFT) 50 MG tablet Take 50 mg by mouth daily.     [provider]  sertraline (ZOLOFT) 50 MG tablet Take 1.5 tablets (75 mg total) by mouth nightly 09/16/22     traMADol (ULTRAM) 50 MG tablet TAKE 1 TABLET BY MOUTH EVERY 6 HOURS AS NEEDED 10/27/22     traMADol (ULTRAM) 50 MG tablet Take 1 tablet (50 mg total) by mouth every 6 (six) hours as needed. 11/06/22     traMADol (ULTRAM) 50 MG tablet TAKE 1 TABLET BY MOUTH EVERY 6 HOURS AS NEEDED 11/24/22     traMADol (ULTRAM) 50 MG tablet Take 1 tablet (50 mg total) by mouth every 6 (six) hours as needed. 04/12/23     traMADol (ULTRAM) 50 MG tablet Take 1 tablet (50 mg total) by mouth every 6 (six) hours as needed. 02/29/24     traMADol (ULTRAM) 50 MG tablet Take 1 tablet (50 mg total) by mouth every 6 (six) hours as needed. 03/02/24     Vitamin D, Ergocalciferol, (DRISDOL) 1.25 MG (50000 UT) CAPS capsule Take 50,000 Units by mouth 2 (two) times a week.  04/23/18   [provider]      Allergies    Azathioprine, Bupropion, Lisinopril, Mycophenolate mofetil, Sulfa antibiotics, Pantoprazole sodium, Singulair [montelukast], and Tramadol    Review of Systems   Review of Systems  Gastrointestinal:  Positive for diarrhea.    Physical Exam Updated Vital Signs BP (!) 132/94 (BP Location: Right Arm)   Pulse 83   Temp 98.7 F (37.1 C)   Resp 16   Ht 5\' 7"  (1.702 m)   Wt 69.4 kg   LMP 01/28/2023 (Approximate)   SpO2 100%   BMI 23.96 kg/m  Physical Exam Vitals and nursing note  reviewed.  Constitutional:      General: She is not in acute distress.    Appearance: She is not toxic-appearing.  HENT:     Head: Normocephalic and atraumatic.  Eyes:     General: No scleral icterus.    Conjunctiva/sclera: Conjunctivae normal.  Cardiovascular:     Rate and Rhythm: Normal rate and regular rhythm.     Pulses: Normal pulses.     Heart sounds: Normal heart sounds.  Pulmonary:     Effort: Pulmonary effort is normal. No respiratory distress.     Breath sounds: Normal breath sounds.  Abdominal:     General: Abdomen is flat. Bowel sounds are normal. There is no distension.     Palpations: Abdomen is soft. There is no mass.     Tenderness: There is no abdominal tenderness.  Musculoskeletal:     Right lower leg: No edema.     Left lower leg: No edema.  Skin:    General: Skin is warm and dry.     Findings: No lesion.  Neurological:     General: No focal deficit present.     Mental Status: She is alert and oriented to person, place, and time. Mental status is at baseline.     ED Results / Procedures / Treatments   Labs (all labs ordered are listed, but only abnormal results are displayed) Labs Reviewed  COMPREHENSIVE METABOLIC PANEL - Abnormal; Notable for the following components:      Result Value   Calcium 8.7 (*)    All other components within normal limits  URINALYSIS, ROUTINE W REFLEX MICROSCOPIC - Abnormal; Notable for the following components:   Specific Gravity, Urine >1.030 (*)    Ketones, ur 40 (*)    All other components within normal limits  RESP PANEL BY RT-PCR (RSV, FLU A&B, COVID)  RVPGX2  C DIFFICILE QUICK SCREEN W PCR REFLEX    GASTROINTESTINAL PANEL BY PCR, STOOL (REPLACES STOOL CULTURE)  LIPASE, BLOOD  CBC  HCG, SERUM, QUALITATIVE    EKG None  Radiology No results found.  Procedures Procedures    Medications Ordered in ED Medications  loperamide (IMODIUM) capsule 4 mg (has no administration in time range)  ondansetron  (ZOFRAN-ODT) disintegrating tablet 4 mg (4 mg Oral Given 03/10/24 1037)  sodium chloride 0.9 % bolus 1,000 mL (1,000 mLs Intravenous New Bag/Given 03/10/24 1241)    ED Course/ Medical Decision Making/ A&P                                 Medical Decision Making Amount and/or Complexity of Data Reviewed Labs: ordered.  Risk Prescription drug management.   This patient presents to the ED for concern of gastroenteritis, this involves an extensive number of treatment options, and is a complaint that carries with it a high risk of complications and morbidity.  The differential diagnosis includes choledocholithiasis, cholelithiasis, cholecystitis, pancreatitis, UTI, gastroenteritis, gastritis, colitis, diverticulitis, appendicitis   Co morbidities that complicate the patient evaluation  HTN GERD Depression Anxiety     Lab Tests:  I personally interpreted labs.  The pertinent results include:      Problem List / ED Course / Critical interventions / Medication management  Patient reporting to emergency room with complaint of nausea vomiting diarrhea.  Symptoms consistent with gastroenteritis.  Patient has no focal area of tenderness on exam and is very well-appearing.  She did have 1 episode of diarrhea during stay.  She was given Imodium.  Also given Zofran and fluids.  She has no leukocytosis and no anemia.  CMP without significant electrolyte abnormality.  No AKI.  No anion gap.  Respiratory panel is negative. Lipase negative. GI panel pending duration of symptoms.  C. difficile panel came back negative.  Given that she has had no focal area of tenderness on exam abdomen is soft and nondistended do not feel imaging is necessary at this time.  She is hemodynamically stable and well-appearing.  Not tachycardic and no fever.  She is feeling better after fluids and Zofran. Recommend  continues hydration and symptoms management. Patient will return if new or worsening symptoms.  I ordered  medication including Zofran, Imodium, NS Reevaluation of the patient after these medicines showed that the patient improved I have reviewed the patients home medicines and have made adjustments as needed   Plan  F/u w/ PCP in 2-3d to ensure resolution of sx.  Patient was given return precautions. Patient stable for discharge at this time.  Patient educated on sx/dx and verbalized understanding of plan. Return to ER w/ new or worsening sx.          Final Clinical Impression(s) / ED Diagnoses Final diagnoses:  Gastroenteritis    Rx / DC Orders ED Discharge Orders     None         Smitty Knudsen, PA-C 03/10/24 1454    Melene Plan, DO 03/10/24 1514

## 2024-03-10 NOTE — ED Triage Notes (Signed)
 Pt c/o nausea, vomiting, and diarrhea x 10 days. Pt has had intermittent stomach burning.

## 2024-03-10 NOTE — Discharge Instructions (Signed)
 Stay well hydrated with water you can alternate Pedialyte and Gatorade.  Take Zofran as needed for nausea or vomiting.  Take Imodium as needed for diarrhea - 4mg , followed by 2mg  if you have diarrhea.  You can also Pepto-Bismol.  Try small frequent bland meals or snacks throughout the day.  Your C. difficile panel came back negative.  Please follow-up with primary care.  Return to emergency room with new or worsening symptoms.

## 2024-03-11 LAB — GASTROINTESTINAL PANEL BY PCR, STOOL (REPLACES STOOL CULTURE)

## 2024-03-13 ENCOUNTER — Emergency Department
Admission: EM | Admit: 2024-03-13 | Discharge: 2024-03-13 | Disposition: A | Discharge status: Other Acute Inpatient Hospital | Attending: Emergency Medicine | Admitting: Emergency Medicine

## 2024-03-13 ENCOUNTER — Encounter: Payer: Self-pay | Admitting: Radiology

## 2024-03-13 ENCOUNTER — Inpatient Hospital Stay (HOSPITAL_COMMUNITY)
Admission: EM | Admit: 2024-03-13 | Discharge: 2024-03-16 | DRG: 330 | Disposition: A | Discharge status: Home / Self Care | Attending: General Surgery | Admitting: General Surgery

## 2024-03-13 ENCOUNTER — Other Ambulatory Visit: Payer: Self-pay

## 2024-03-13 ENCOUNTER — Emergency Department

## 2024-03-13 DIAGNOSIS — Y838 Other surgical procedures as the cause of abnormal reaction of the patient, or of later complication, without mention of misadventure at the time of the procedure: Secondary | ICD-10-CM | POA: Diagnosis present

## 2024-03-13 DIAGNOSIS — Z885 Allergy status to narcotic agent status: Secondary | ICD-10-CM

## 2024-03-13 DIAGNOSIS — R109 Unspecified abdominal pain: Secondary | ICD-10-CM | POA: Diagnosis not present

## 2024-03-13 DIAGNOSIS — Z9884 Bariatric surgery status: Secondary | ICD-10-CM

## 2024-03-13 DIAGNOSIS — Z882 Allergy status to sulfonamides status: Secondary | ICD-10-CM | POA: Diagnosis not present

## 2024-03-13 DIAGNOSIS — K2970 Gastritis, unspecified, without bleeding: Secondary | ICD-10-CM | POA: Diagnosis not present

## 2024-03-13 DIAGNOSIS — Z87891 Personal history of nicotine dependence: Secondary | ICD-10-CM

## 2024-03-13 DIAGNOSIS — I10 Essential (primary) hypertension: Secondary | ICD-10-CM | POA: Diagnosis present

## 2024-03-13 DIAGNOSIS — K561 Intussusception: Secondary | ICD-10-CM | POA: Insufficient documentation

## 2024-03-13 DIAGNOSIS — K9589 Other complications of other bariatric procedure: Secondary | ICD-10-CM | POA: Diagnosis present

## 2024-03-13 DIAGNOSIS — Z8249 Family history of ischemic heart disease and other diseases of the circulatory system: Secondary | ICD-10-CM

## 2024-03-13 DIAGNOSIS — R112 Nausea with vomiting, unspecified: Secondary | ICD-10-CM | POA: Diagnosis present

## 2024-03-13 DIAGNOSIS — Z888 Allergy status to other drugs, medicaments and biological substances status: Secondary | ICD-10-CM

## 2024-03-13 DIAGNOSIS — K660 Peritoneal adhesions (postprocedural) (postinfection): Secondary | ICD-10-CM | POA: Diagnosis present

## 2024-03-13 DIAGNOSIS — K219 Gastro-esophageal reflux disease without esophagitis: Secondary | ICD-10-CM | POA: Diagnosis present

## 2024-03-13 DIAGNOSIS — R197 Diarrhea, unspecified: Secondary | ICD-10-CM | POA: Insufficient documentation

## 2024-03-13 DIAGNOSIS — K458 Other specified abdominal hernia without obstruction or gangrene: Secondary | ICD-10-CM | POA: Diagnosis present

## 2024-03-13 LAB — COMPREHENSIVE METABOLIC PANEL
ALT: 13 U/L (ref 0–44)
AST: 20 U/L (ref 15–41)
Albumin: 3.8 g/dL (ref 3.5–5.0)
Alkaline Phosphatase: 67 U/L (ref 38–126)
Anion gap: 10 (ref 5–15)
BUN: 17 mg/dL (ref 6–20)
CO2: 25 mmol/L (ref 22–32)
Calcium: 9 mg/dL (ref 8.9–10.3)
Chloride: 105 mmol/L (ref 98–111)
Creatinine, Ser: 0.61 mg/dL (ref 0.44–1.00)
GFR, Estimated: >60 mL/min (ref >60)
Glucose, Bld: 87 mg/dL (ref 70–99)
Potassium: 3.9 mmol/L (ref 3.5–5.1)
Sodium: 140 mmol/L (ref 135–145)
Total Bilirubin: 0.6 mg/dL (ref 0.0–1.2)
Total Protein: 7.1 g/dL (ref 6.5–8.1)

## 2024-03-13 LAB — URINALYSIS, ROUTINE W REFLEX MICROSCOPIC
Bacteria, UA: NONE SEEN
Bilirubin Urine: NEGATIVE
Glucose, UA: NEGATIVE mg/dL
Hgb urine dipstick: NEGATIVE
Ketones, ur: 20 mg/dL — AB
Leukocytes,Ua: NEGATIVE
Nitrite: NEGATIVE
Protein, ur: 30 mg/dL — AB
Specific Gravity, Urine: 1.031 — ABNORMAL HIGH (ref 1.005–1.030)
pH: 5 (ref 5.0–8.0)

## 2024-03-13 LAB — CBC
HCT: 40.7 % (ref 36.0–46.0)
Hemoglobin: 13.5 g/dL (ref 12.0–15.0)
MCH: 27.6 pg (ref 26.0–34.0)
MCHC: 33.2 g/dL (ref 30.0–36.0)
MCV: 83.2 fL (ref 80.0–100.0)
Platelets: 309 10*3/uL (ref 150–400)
RBC: 4.89 MIL/uL (ref 3.87–5.11)
RDW: 13.2 % (ref 11.5–15.5)
WBC: 6.2 10*3/uL (ref 4.0–10.5)
nRBC: 0 % (ref 0.0–0.2)

## 2024-03-13 LAB — LACTIC ACID, PLASMA: Lactic Acid, Venous: 0.7 mmol/L (ref 0.5–1.9)

## 2024-03-13 LAB — POC URINE PREG, ED: Preg Test, Ur: NEGATIVE

## 2024-03-13 LAB — LIPASE, BLOOD: Lipase: 30 U/L (ref 11–51)

## 2024-03-13 MED ORDER — DIPHENHYDRAMINE HCL 50 MG/ML IJ SOLN: 25.0000 mg | Freq: Four times a day (QID) | INTRAMUSCULAR | Status: DC | PRN

## 2024-03-13 MED ORDER — SODIUM CHLORIDE 0.9 % IV BOLUS
1000.0000 mL | Freq: Once | INTRAVENOUS | Status: AC
  Administered 2024-03-13: 1000 mL via INTRAVENOUS

## 2024-03-13 MED ORDER — ONDANSETRON HCL 4 MG/2ML IJ SOLN
4.0000 mg | Freq: Four times a day (QID) | INTRAMUSCULAR | Status: DC | PRN
  Administered 2024-03-14 – 2024-03-16 (×2): 4 mg via INTRAVENOUS
  Filled 2024-03-13 (×4): qty 2

## 2024-03-13 MED ORDER — DIPHENHYDRAMINE HCL 25 MG PO CAPS: 25.0000 mg | ORAL_CAPSULE | Freq: Four times a day (QID) | ORAL | Status: DC | PRN

## 2024-03-13 MED ORDER — DIPHENOXYLATE-ATROPINE 2.5-0.025 MG PO TABS
1.0000 | ORAL_TABLET | Freq: Once | ORAL | Status: AC
Start: 2024-03-13 — End: 2024-03-13
  Administered 2024-03-13: 1 via ORAL
  Filled 2024-03-13: qty 1

## 2024-03-13 MED ORDER — METOCLOPRAMIDE HCL 5 MG/ML IJ SOLN
10.0000 mg | Freq: Once | INTRAMUSCULAR | Status: AC
  Administered 2024-03-13: 10 mg via INTRAVENOUS
  Filled 2024-03-13: qty 2

## 2024-03-13 MED ORDER — IOHEXOL 300 MG/ML  SOLN
80.0000 mL | Freq: Once | INTRAMUSCULAR | Status: AC | PRN
  Administered 2024-03-13: 80 mL via INTRAVENOUS

## 2024-03-13 MED ORDER — ALBUTEROL SULFATE (2.5 MG/3ML) 0.083% IN NEBU: 3.0000 mL | INHALATION_SOLUTION | RESPIRATORY_TRACT | Status: DC | PRN

## 2024-03-13 MED ORDER — ENOXAPARIN SODIUM 40 MG/0.4ML IJ SOSY: 40.0000 mg | PREFILLED_SYRINGE | INTRAMUSCULAR | Status: DC

## 2024-03-13 MED ORDER — CLONAZEPAM 0.5 MG PO TABS
0.5000 mg | ORAL_TABLET | Freq: Every evening | ORAL | Status: DC | PRN
  Administered 2024-03-14: 0.5 mg via ORAL
  Filled 2024-03-13: qty 1

## 2024-03-13 MED ORDER — METOCLOPRAMIDE HCL 5 MG/ML IJ SOLN
10.0000 mg | INTRAMUSCULAR | Status: AC
  Administered 2024-03-13: 10 mg via INTRAVENOUS
  Filled 2024-03-13: qty 2

## 2024-03-13 MED ORDER — ONDANSETRON 4 MG PO TBDP: 4.0000 mg | ORAL_TABLET | Freq: Four times a day (QID) | ORAL | Status: DC | PRN

## 2024-03-13 MED ORDER — HYDROMORPHONE HCL 1 MG/ML IJ SOLN
1.0000 mg | INTRAMUSCULAR | Status: DC | PRN
  Administered 2024-03-14 – 2024-03-15 (×4): 1 mg via INTRAVENOUS
  Filled 2024-03-13 (×5): qty 1

## 2024-03-13 MED ORDER — KCL IN DEXTROSE-NACL 40-5-0.9 MEQ/L-%-% IV SOLN
INTRAVENOUS | Status: AC
  Filled 2024-03-13 (×4): qty 1000

## 2024-03-13 NOTE — ED Provider Notes (Signed)
 [facility] Provider Note    Event Date/Time   First MD Initiated Contact with Patient 03/13/24 [phone]     (approximate)  History   Chief Complaint: Abdominal Pain  HPI  TASIA LIZ is a 53 y.o. female with a past medical history of Estelle June, gastric reflux, hypertension, prior gastric bypass, presents to the emergency department for nausea vomiting and diarrhea.  According to the patient for the past 2+ weeks she has been experiencing nausea vomiting and diarrhea.  States the vomiting is somewhat subsided but she is still nauseated with frequent episodes of diarrhea.  Denies any fever.  No urinary symptoms.  Patient was seen in the emergency department 3 days ago for the same.  Patient had a reassuring workup at that time with a negative respiratory panel, negative C. difficile, negative GI panel, normal urinalysis, reassuring lab work with a normal CBC including white blood cell count, reassuring chemistry.  No abdominal imaging was performed at that time as the patient states she was not having much abdominal discomfort which she states she continues to have abdominal cramping which is worsening so the patient return to the emergency department today.  Patient has been using Zofran and Imodium without relief.  Physical Exam   Triage Vital Signs: ED Triage Vitals [03/13/24 0834]  Encounter Vitals Group     BP 116/81     Systolic BP Percentile      Diastolic BP Percentile      Pulse Rate 97     Resp 17     Temp 98.4 F (36.9 C)     Temp Source Oral     SpO2 99 %     Weight 153 lb (69.4 kg)     Height 5\' 7"  (1.702 m)     Head Circumference      Peak Flow      Pain Score 6     Pain Loc      Pain Education      Exclude from Growth Chart     Most recent vital signs: Vitals:   03/13/24 0834  BP: 116/81  Pulse: 97  Resp: 17  Temp: 98.4 F (36.9 C)  SpO2: 99%    General: Awake, no distress.  CV:  Good peripheral perfusion.   Resp:  Normal  effort. Abd:  No distention.  Soft, patient states mild diffuse pain, no focal tenderness.  ED Results / Procedures / Treatments   RADIOLOGY  I have reviewed interpret the CT images.  Patient does appear to have abnormal bowel with some mild stranding in the mid abdomen. Radiology has read the CT scan as concern for 7 cm segment of intussusception around the jejunojejunostomy anastomosis of her gastric bypass.   MEDICATIONS ORDERED IN ED: Medications - No data to display   IMPRESSION / MDM / ASSESSMENT AND PLAN / ED COURSE  I reviewed the triage vital signs and the nursing notes.  Patient's presentation is most consistent with acute presentation with potential threat to life or bodily function.  Patient presents to the emergency department for 2 weeks now with nausea vomiting and diarrhea.  No fever.  No focal abdominal pain.  We will check labs and proceed with a CT scan of the abdomen and pelvis, treat with nausea medication and IV hydrate while awaiting results.  Patient's workup shows a reassuring CBC reassuring chemistry, urinalysis shows no concerning findings, pregnancy test is negative.  Lipase normal.  Patient CT scan however does show  a small bowel intussusception at the site of the jejunum oh-jejunostomy anastomosis.  No evidence of obstruction.  I spoke to the patient regarding this, patient had her bypass performed by Dr. Doylene Canard of [facility] surgery.  We will attempt to contact Dr. Doylene Canard or her team to discuss the patient's clinical presentation and CT findings to see what if any additional treatment is needed.  We were able to reach Dr. Eustaquio Boyden office at [facility] surgery.  We have left a message with the secretary and they are paging out a provider.  The remainder of the patient's workup shows no concerning findings with a normal CBC normal chemistry normal urinalysis negative pregnancy test, negative/normal lipase.  However given the intussusception I would like  to speak to the patient surgical provider to see what additional if any treatment is needed.  Patient care signed out to oncoming provider.  FINAL CLINICAL IMPRESSION(S) / ED DIAGNOSES   Abdominal pain Nausea vomiting diarrhea Intussusception  Note:  This document was prepared using Dragon voice recognition software and may include unintentional dictation errors.   Minna Antis, MD 03/13/24 1452

## 2024-03-13 NOTE — ED Triage Notes (Signed)
 From [facility] via [facility] for n/v/d,Dx with intussusception after recent gastric bypass sx.

## 2024-03-13 NOTE — ED Provider Notes (Signed)
 Procedures     ----------------------------------------- 6:53 PM on 03/13/2024 ----------------------------------------- CT findings d/w [facility] Dr. Carman Ching who recommends ED to ED transfer to [facility] long tonight.  Request lactic acid level now triage for tissue ischemia.  Patient reports nausea is worse, will give additional dose of Reglan.    ----------------------------------------- 7:12 PM on 03/13/2024 ----------------------------------------- Discussed with [facility], ED physician Dr. Celso Sickle who accepts for transfer.   Sharman Cheek, MD 03/13/24 [phone]

## 2024-03-13 NOTE — ED Notes (Signed)
 ED TO INPATIENT HANDOFF REPORT  Name/Age/Gender Ashley Paul 53 y.o. female  Code Status    Code Status Orders  (From admission, onward)           Start     Ordered   03/13/24 2059  Full code  Continuous       Question:  By:  Answer:  Procedural case: previous code status reviewed   03/13/24 2102           Code Status History     Date Active Date Inactive Code Status Order ID Comments User Context   03/07/2019 1300 03/09/2019 1410 Full Code [id number]  Berna Bue, MD Inpatient   10/20/2017 1438 10/20/2017 2306 Full Code [id number]  Coletta Memos, MD Inpatient       Home/SNF/Other Home  Chief Complaint Intussusception of intestine (HCC) [K56.1]  Level of Care/Admitting Diagnosis ED Disposition     ED Disposition  Admit   Condition  --   Comment  Hospital Area: [facility] [[id number]]  Level of Care: Med-Surg [16]  May admit patient to [facility] or [facility] if equivalent level of care is available:: No  Covid Evaluation: Asymptomatic - no recent exposure (last 10 days) testing not required  Diagnosis: Intussusception of intestine [facility]) [[id number]]  Admitting Physician: Abigail Miyamoto [[id number]]  Attending Physician: [facility], MD [[id number]]  Certification:: I certify this patient will need inpatient services for at least 2 midnights  Expected Medical Readiness: 03/15/2024          Medical History Past Medical History:  Diagnosis Date   Allergy    Anxiety    Arthritis    neck, lower back   Depression    GERD (gastroesophageal reflux disease)    Goiter    Hypertension    Obesity     Allergies Allergies  Allergen Reactions   Azathioprine Other (See Comments)    Flu-like symptoms, tachycardia   Bupropion Hives   Lisinopril Cough   Mycophenolate Mofetil Hives   Misc. Sulfonamide Containing Compounds Other (See Comments)   Sulfa Antibiotics Swelling    SWELLING REACTION UNSPECIFIED    Pantoprazole Palpitations   Pantoprazole  Sodium Palpitations   Singulair [Montelukast] Anxiety    anxiety   Tramadol Anxiety    Severe anxiety - pt states this is not an allergy    IV Location/Drains/Wounds Patient Lines/Drains/Airways Status     Active Line/Drains/Airways     Name Placement date Placement time Site Days   Peripheral IV 03/13/24 Right Antecubital 03/13/24  0845  Antecubital  less than 1   Incision - 6 Ports Abdomen Right;Lateral Right;Medial Left;Medial Left;Medial;Lateral Left;Lateral Mid;Upper 03/07/19  0750  -- 1833            Labs/Imaging Results for orders placed or performed during the hospital encounter of 03/13/24 (from the past 48 hours)  Lipase, blood     Status: None   Collection Time: 03/13/24  8:42 AM  Result Value Ref Range   Lipase 30 11 - 51 U/L    Comment: Performed at [facility], [address] [place], [place] [address]  Comprehensive metabolic panel     Status: None   Collection Time: 03/13/24  8:42 AM  Result Value Ref Range   Sodium 140 135 - 145 mmol/L   Potassium 3.9 3.5 - 5.1 mmol/L   Chloride 105 98 - 111 mmol/L   CO2 25 22 - 32 mmol/L   Glucose, Bld 87 70 - 99 mg/dL  Comment: Glucose reference range applies only to samples taken after fasting for at least 8 hours.   BUN 17 6 - 20 mg/dL   Creatinine, Ser 2.84 0.44 - 1.00 mg/dL   Calcium 9.0 8.9 - 13.2 mg/dL   Total Protein 7.1 6.5 - 8.1 g/dL   Albumin 3.8 3.5 - 5.0 g/dL   AST 20 15 - 41 U/L   ALT 13 0 - 44 U/L   Alkaline Phosphatase 67 38 - 126 U/L   Total Bilirubin 0.6 0.0 - 1.2 mg/dL   GFR, Estimated >44 >01 mL/min    Comment: (NOTE) Calculated using the CKD-EPI Creatinine Equation (2021)    Anion gap 10 5 - 15    Comment: Performed at [facility], [address] [place], [place] [address]  CBC     Status: None   Collection Time: 03/13/24  8:42 AM  Result Value Ref Range   WBC 6.2 4.0 - 10.5 K/uL   RBC 4.89 3.87 - 5.11 MIL/uL   Hemoglobin 13.5 12.0 - 15.0 g/dL   HCT 36.6 44.0 -  34.7 %   MCV 83.2 80.0 - 100.0 fL   MCH 27.6 26.0 - 34.0 pg   MCHC 33.2 30.0 - 36.0 g/dL   RDW 42.5 95.6 - 38.7 %   Platelets 309 150 - 400 K/uL   nRBC 0.0 0.0 - 0.2 %    Comment: Performed at [facility], [address] [place], [place] [address]  Urinalysis, Routine w reflex microscopic -Urine, Clean Catch     Status: Abnormal   Collection Time: 03/13/24 10:13 AM  Result Value Ref Range   Color, Urine AMBER (A) YELLOW    Comment: BIOCHEMICALS MAY BE AFFECTED BY COLOR   APPearance CLOUDY (A) CLEAR   Specific Gravity, Urine 1.031 (H) 1.005 - 1.030   pH 5.0 5.0 - 8.0   Glucose, UA NEGATIVE NEGATIVE mg/dL   Hgb urine dipstick NEGATIVE NEGATIVE   Bilirubin Urine NEGATIVE NEGATIVE   Ketones, ur 20 (A) NEGATIVE mg/dL   Protein, ur 30 (A) NEGATIVE mg/dL   Nitrite NEGATIVE NEGATIVE   Leukocytes,Ua NEGATIVE NEGATIVE   RBC / HPF 0-5 0 - 5 RBC/hpf   WBC, UA 0-5 0 - 5 WBC/hpf   Bacteria, UA NONE SEEN NONE SEEN   Squamous Epithelial / HPF 21-50 0 - 5 /HPF   Mucus PRESENT     Comment: Performed at [facility], [address] [place], [place] [address]  POC urine preg, ED     Status: None   Collection Time: 03/13/24 10:17 AM  Result Value Ref Range   Preg Test, Ur Negative Negative  Lactic acid, plasma     Status: None   Collection Time: 03/13/24  6:59 PM  Result Value Ref Range   Lactic Acid, Venous 0.7 0.5 - 1.9 mmol/L    Comment: Performed at [facility], [address] [place], [place] [address]   CT ABDOMEN PELVIS W CONTRAST Result Date: 03/13/2024 CLINICAL DATA:  Acute abdominal pain vomiting, and diarrhea for 2 weeks. Gastritis. EXAM: CT ABDOMEN AND PELVIS WITH CONTRAST TECHNIQUE: Multidetector CT imaging of the abdomen and pelvis was performed using the standard protocol following bolus administration of intravenous contrast. RADIATION DOSE REDUCTION: This exam was performed according to the departmental dose-optimization program which includes  automated exposure control, adjustment of the mA and/or kV according to patient size and/or use of iterative reconstruction technique. CONTRAST:  80mL OMNIPAQUE IOHEXOL 300 MG/ML  SOLN  COMPARISON:  None Available. FINDINGS: Lower Chest: No acute findings. Hepatobiliary: No suspicious hepatic masses identified. Prior cholecystectomy. No evidence of biliary obstruction. Pancreas:  No mass or inflammatory changes. Spleen: Within normal limits in size and appearance. Adrenals/Urinary Tract: No suspicious masses identified. No evidence of ureteral calculi or hydronephrosis. Stomach/Bowel: Prior gastric bypass surgery noted. A small bowel intussusception measuring approximately 7 cm length is seen in the central abdomen, at the site of the jejuno-jejunostomy anastomosis. No evidence of bowel obstruction, inflammatory process or abnormal fluid collections. Vascular/Lymphatic: No pathologically enlarged lymph nodes. No acute vascular findings. Reproductive:  No mass or other significant abnormality. Other:  None. Musculoskeletal:  No suspicious bone lesions identified. IMPRESSION: Prior Roux-en-Y gastric bypass surgery, with small bowel intussusception at the site of the jejuno-jejunostomy anastomosis. No evidence of bowel obstruction, perforation, or inflammatory process. Electronically Signed   By: Danae Orleans M.D.   On: 03/13/2024 12:01    Pending Labs [employer] (From admission, onward)     Start     Ordered   Signed and Held  HIV Antibody (routine testing w rflx)  (HIV Antibody (Routine testing w reflex) panel)  Once,   R        Signed and Held   Signed and Held  Basic metabolic panel  Tomorrow morning,   R        Signed and Held   Signed and Held  CBC  Tomorrow morning,   R        Signed and Held            Vitals/Pain Today's Vitals   03/13/24 2047  BP: 129/87  Pulse: 76  Resp: 18  Temp: 98 F (36.7 C)  TempSrc: Oral  SpO2: 100%    Isolation Precautions No active  isolations  Medications Medications - No data to display  Mobility walks

## 2024-03-13 NOTE — ED Triage Notes (Signed)
 Pt here with abd pain, N, V, and D for 14 days. Pt states she was recently at [facility] and was dx with gastritis. Pt states her stools have been very loose as well.

## 2024-03-13 NOTE — ED Notes (Signed)
 Patient transported to CT

## 2024-03-13 NOTE — ED Notes (Signed)
 ..  EMTALA: REQUIRED DOCUMENTATION COMPLETED AND REVIEWED BY WRITER PRIOR TO PT TRANSFER MD REASSESSMENT EMTALA RN SECTION TRANSFER E-SIGN VS WITHIN REQUIRED TIME

## 2024-03-13 NOTE — ED Provider Notes (Signed)
 [place] EMERGENCY DEPARTMENT AT [facility] Provider Note   CSN: [id number] Arrival date & time: 03/13/24  2039     History  Chief Complaint  Patient presents with   Abdominal Pain    Ashley Paul is a 53 y.o. female.  With a history of status post Roux-en-Y gastric bypass (2020) who presents to the ED for abdominal pain.  Ongoing abdominal pain for 2 weeks.  Was seen for this reason earlier today at [facility] ED where she was noted to have 7 cm intussusception at site of anastomosis.  She was sent here for surgical evaluation.  Nausea well-controlled with Reglan at this time   Abdominal Pain      Home Medications Prior to Admission medications   Medication Sig Start Date End Date Taking? Authorizing Provider  acetaminophen (TYLENOL) 325 MG tablet Take 650 mg by mouth every 6 (six) hours as needed for headache (pain).    [provider]  albuterol (VENTOLIN HFA) 108 (90 Base) MCG/ACT inhaler Inhale 2 puffs into the lungs every 4 (four) hours as needed for wheezing or shortness of breath. 03/23/22   White, Elita Boone, NP  beclomethasone (QVAR REDIHALER) 80 MCG/ACT inhaler Inhale 1 puff into the lungs 2 (two) times daily. 12/07/18   Janalyn Harder, PA-C  busPIRone (BUSPAR) 5 MG tablet Take 1 tablet (5 mg total) by mouth 2 (two) times daily 05/27/23     clobetasol cream (TEMOVATE) 0.05 % Apply topically 2 (two) times daily 09/18/22     Clobetasol Propionate 0.05 % shampoo Apply thin film to dry scalp leave in place for 15 minutes, then add water and lather.  Rinse thoroughly. Use once daily 09/18/22     clonazePAM (KLONOPIN) 0.5 MG tablet Take 1 tablet (0.5 mg total) by mouth at bedtime as needed for anxiety. 05/25/23     clonazePAM (KLONOPIN) 0.5 MG tablet Take 1 tablet (0.5 mg total) by mouth at bedtime as needed for anxiety. 30 day supply 07/13/23     esomeprazole (NEXIUM) 20 MG packet Take 20 mg by mouth daily before breakfast. 03/09/19    Berna Bue, MD  gabapentin (NEURONTIN) 100 MG capsule Take 2 capsules (200 mg total) by mouth every 12 (twelve) hours for 7 days. 03/09/19 03/16/19  Berna Bue, MD  gabapentin (NEURONTIN) 100 MG capsule TAKE 1 CAPSULE BY MOUTH DAILY AT BEDTIME. 12/12/20 12/12/21  Su Hoff, PA-C  gabapentin (NEURONTIN) 100 MG capsule Take 1 capsule every day by oral route at bedtime. 12/08/21   Su Hoff, PA-C  gabapentin (NEURONTIN) 100 MG capsule Take 1 capsule (100 mg total) by mouth at bedtime. 02/14/23     HYDROcodone-acetaminophen (NORCO) 10-325 MG tablet Take 1 tablet by mouth 3 (three) times daily as needed. 01/12/23     HYDROcodone-acetaminophen (NORCO) 10-325 MG tablet Take 1 tablet by mouth 3 (three) times daily as needed. 01/13/23     HYDROcodone-acetaminophen (NORCO) 10-325 MG tablet Take 1 tablet by mouth 2 (two) times daily as needed. 01/31/24     HYDROcodone-acetaminophen (NORCO) 10-325 MG tablet Take 1 tablet by mouth 2 (two) times daily as needed. 03/09/24     hydrOXYzine (ATARAX/VISTARIL) 50 MG tablet Take 50 mg by mouth every 8 (eight) hours as needed for anxiety.  09/06/18   [provider]  influenza vac split quadrivalent PF (FLUARIX) 0.5 ML injection Inject into the muscle. 09/25/21   Judyann Munson, MD  loperamide (IMODIUM) 2 MG capsule Take 1 capsule (2  mg total) by mouth 4 (four) times daily as needed for diarrhea or loose stools. 03/10/24   Barrett, Horald Chestnut, PA-C  losartan (COZAAR) 100 MG tablet Take 0.5 tablets (50 mg total) by mouth daily. 03/09/19   Berna Bue, MD  nitrofurantoin, macrocrystal-monohydrate, (MACROBID) 100 MG capsule Take 1 capsule (100 mg total) by mouth 2 (two) times daily. 12/22/20   Delton See, MD  ondansetron (ZOFRAN-ODT) 4 MG disintegrating tablet Take 1 tablet (4 mg total) by mouth every 8 (eight) hours as needed for nausea or vomiting. 03/10/24   Barrett, Horald Chestnut, PA-C  oxyCODONE (ROXICODONE) 5 MG/5ML solution Take 5 mLs (5 mg  total) by mouth every 6 (six) hours as needed for severe pain (please minimize narcotics). 03/09/19   Berna Bue, MD  predniSONE (DELTASONE) 10 MG tablet Take by mouth daily. 03/23/22   Valinda Hoar, NP  sertraline (ZOLOFT) 100 MG tablet Take 1 tablet (100 mg total) by mouth at bedtime. 09/18/22     sertraline (ZOLOFT) 100 MG tablet Take 1.5 tablets (150 mg total) by mouth at bedtime. 05/25/23     sertraline (ZOLOFT) 50 MG tablet Take 50 mg by mouth daily.     [provider]  sertraline (ZOLOFT) 50 MG tablet Take 1.5 tablets (75 mg total) by mouth nightly 09/16/22     traMADol (ULTRAM) 50 MG tablet TAKE 1 TABLET BY MOUTH EVERY 6 HOURS AS NEEDED 10/27/22     traMADol (ULTRAM) 50 MG tablet Take 1 tablet (50 mg total) by mouth every 6 (six) hours as needed. 11/06/22     traMADol (ULTRAM) 50 MG tablet TAKE 1 TABLET BY MOUTH EVERY 6 HOURS AS NEEDED 11/24/22     traMADol (ULTRAM) 50 MG tablet Take 1 tablet (50 mg total) by mouth every 6 (six) hours as needed. 04/12/23     traMADol (ULTRAM) 50 MG tablet Take 1 tablet (50 mg total) by mouth every 6 (six) hours as needed. 02/29/24     traMADol (ULTRAM) 50 MG tablet Take 1 tablet (50 mg total) by mouth every 6 (six) hours as needed. 03/02/24     Vitamin D, Ergocalciferol, (DRISDOL) 1.25 MG (50000 UT) CAPS capsule Take 50,000 Units by mouth 2 (two) times a week.  04/23/18   [provider]      Allergies    Azathioprine, Bupropion, Lisinopril, Mycophenolate mofetil, Misc. sulfonamide containing compounds, Sulfa antibiotics, Pantoprazole, Pantoprazole sodium, Singulair [montelukast], and Tramadol    Review of Systems   Review of Systems  Gastrointestinal:  Positive for abdominal pain.    Physical Exam Updated Vital Signs BP 129/87 (BP Location: Left Arm)   Pulse 76   Temp 98 F (36.7 C) (Oral)   Resp 18   LMP 01/28/2023 (Approximate)   SpO2 100%  Physical Exam Vitals and nursing note reviewed.  HENT:     Head: Normocephalic  and atraumatic.  Eyes:     Pupils: Pupils are equal, round, and reactive to light.  Cardiovascular:     Rate and Rhythm: Normal rate and regular rhythm.  Pulmonary:     Effort: Pulmonary effort is normal.     Breath sounds: Normal breath sounds.  Abdominal:     Palpations: Abdomen is soft.     Tenderness: There is no abdominal tenderness.  Skin:    General: Skin is warm and dry.  Neurological:     Mental Status: She is alert.  Psychiatric:        Mood and Affect:  Mood normal.     ED Results / Procedures / Treatments   Labs (all labs ordered are listed, but only abnormal results are displayed) Labs Reviewed - No data to display  EKG None  Radiology CT ABDOMEN PELVIS W CONTRAST Result Date: 03/13/2024 CLINICAL DATA:  Acute abdominal pain vomiting, and diarrhea for 2 weeks. Gastritis. EXAM: CT ABDOMEN AND PELVIS WITH CONTRAST TECHNIQUE: Multidetector CT imaging of the abdomen and pelvis was performed using the standard protocol following bolus administration of intravenous contrast. RADIATION DOSE REDUCTION: This exam was performed according to the departmental dose-optimization program which includes automated exposure control, adjustment of the mA and/or kV according to patient size and/or use of iterative reconstruction technique. CONTRAST:  80mL OMNIPAQUE IOHEXOL 300 MG/ML  SOLN COMPARISON:  None Available. FINDINGS: Lower Chest: No acute findings. Hepatobiliary: No suspicious hepatic masses identified. Prior cholecystectomy. No evidence of biliary obstruction. Pancreas:  No mass or inflammatory changes. Spleen: Within normal limits in size and appearance. Adrenals/Urinary Tract: No suspicious masses identified. No evidence of ureteral calculi or hydronephrosis. Stomach/Bowel: Prior gastric bypass surgery noted. A small bowel intussusception measuring approximately 7 cm length is seen in the central abdomen, at the site of the jejuno-jejunostomy anastomosis. No evidence of bowel  obstruction, inflammatory process or abnormal fluid collections. Vascular/Lymphatic: No pathologically enlarged lymph nodes. No acute vascular findings. Reproductive:  No mass or other significant abnormality. Other:  None. Musculoskeletal:  No suspicious bone lesions identified. IMPRESSION: Prior Roux-en-Y gastric bypass surgery, with small bowel intussusception at the site of the jejuno-jejunostomy anastomosis. No evidence of bowel obstruction, perforation, or inflammatory process. Electronically Signed   By: Danae Orleans M.D.   On: 03/13/2024 12:01    Procedures Procedures    Medications Ordered in ED Medications - No data to display  ED Course/ Medical Decision Making/ A&P                                 Medical Decision Making 53 year old female with history as above transferred from [facility] ED after finding of 7 cm intussusception at site of previous Roux-en-Y anastomoses.  Dr. Magnus Ivan general surgery is aware of patient's arrival in the ED and has already evaluated her.  She will be admitted to surgical service.  No current plan for OR tonight  Risk Decision regarding hospitalization.           Final Clinical Impression(s) / ED Diagnoses Final diagnoses:  Intussusception (HCC)  History of Roux-en-Y gastric bypass    Rx / DC Orders ED Discharge Orders     None         Royanne Foots, DO 03/13/24 2107

## 2024-03-13 NOTE — H&P (Signed)
 SHAKA ZECH is an 53 y.o. female.   Chief Complaint: nausea, abdominal pain HPI: This is a pleasant 53 year old female with a prior history of a laparoscopic Roux-en-Y bypass in 2020 by Dr. Doylene Canard.  She had been doing well until about 2 weeks ago when she started having some nausea with intermittent belching.  She then started having diarrhea and then started having vomiting with cramping abdominal pain.  She was seen in the emergency department on 3/14.  Labs on that day were normal and no imaging was performed.  She presented to [facility] this morning with ongoing complaints of cramping abdominal pain with nausea and diarrhea.  She underwent a CT scan of the abdomen and pelvis suggesting intussusception at her JJ anastomosis.  The decision was made to transfer her to [facility] for further evaluation.  She reports that now she has minimal discomfort.  Her biggest complaint is intermittent nausea.  She has no cardiopulmonary issues and no other complaints.  She had no hematemesis or blood in her stool.  Past Medical History:  Diagnosis Date   Allergy    Anxiety    Arthritis    neck, lower back   Depression    GERD (gastroesophageal reflux disease)    Goiter    Hypertension    Obesity     Past Surgical History:  Procedure Laterality Date   BREAST BIOPSY Right 07/19/2019   [place] bx fibroadeoma coil clip   BREAST CYST EXCISION Right 1993   CATARACT EXTRACTION W/PHACO Left 12/26/2018   Procedure: CATARACT EXTRACTION PHACO AND INTRAOCULAR LENS PLACEMENT (IOC)  LEFT;  Surgeon: Nevada Crane, MD;  Location: [facility];  Service: Ophthalmology;  Laterality: Left;   CATARACT EXTRACTION W/PHACO Right 02/06/2019   Procedure: CATARACT EXTRACTION PHACO AND INTRAOCULAR LENS PLACEMENT (IOC)  RIGHT;  Surgeon: Nevada Crane, MD;  Location: [facility];  Service: Ophthalmology;  Laterality: Right;   CERVICAL DISC ARTHROPLASTY N/A 10/20/2017   Procedure: ARTIFICIAL DISC REPLACEMENT  CERVICAL FIVE- CERVICAL SIX;  Surgeon: Coletta Memos, MD;  Location: [facility] OR;  Service: Neurosurgery;  Laterality: N/A;   CESAREAN SECTION     CHOLECYSTECTOMY     EYE SURGERY Right    bx 2015   GASTRIC ROUX-EN-Y  03/07/2019   Procedure: LAPAROSCOPIC ROUX-EN-Y GASTRIC BYPASS WITH UPPER ENDOSCOPY;  Surgeon: Berna Bue, MD;  Location: [facility];  Service: General;;   TUBAL LIGATION      Family History  Problem Relation Age of Onset   Heart disease Paternal Grandfather    Colon cancer Neg Hx    Esophageal cancer Neg Hx    Pancreatic cancer Neg Hx    Stomach cancer Neg Hx    Liver disease Neg Hx    Rectal cancer Neg Hx    Breast cancer Neg Hx    Social History:  reports that she quit smoking about 14 years ago. Her smoking use included cigarettes. She started smoking about 41 years ago. She has a 27 pack-year smoking history. She has never used smokeless tobacco. She reports that she does not drink alcohol and does not use drugs.  Allergies:  Allergies  Allergen Reactions   Azathioprine Other (See Comments)    Flu-like symptoms, tachycardia   Bupropion Hives   Lisinopril Cough   Mycophenolate Mofetil Hives   Misc. Sulfonamide Containing Compounds Other (See Comments)   Sulfa Antibiotics Swelling    SWELLING REACTION UNSPECIFIED    Pantoprazole Palpitations   Pantoprazole Sodium Palpitations  Singulair [Montelukast] Anxiety    anxiety   Tramadol Anxiety    Severe anxiety - pt states this is not an allergy    (Not in a hospital admission)   Results for orders placed or performed during the hospital encounter of 03/13/24 (from the past 48 hours)  Lipase, blood     Status: None   Collection Time: 03/13/24  8:42 AM  Result Value Ref Range   Lipase 30 11 - 51 U/L    Comment: Performed at [facility], [address] [place], [place] [address]  Comprehensive metabolic panel     Status: None   Collection Time: 03/13/24  8:42 AM  Result Value Ref Range   Sodium  140 135 - 145 mmol/L   Potassium 3.9 3.5 - 5.1 mmol/L   Chloride 105 98 - 111 mmol/L   CO2 25 22 - 32 mmol/L   Glucose, Bld 87 70 - 99 mg/dL    Comment: Glucose reference range applies only to samples taken after fasting for at least 8 hours.   BUN 17 6 - 20 mg/dL   Creatinine, Ser 4.78 0.44 - 1.00 mg/dL   Calcium 9.0 8.9 - 29.5 mg/dL   Total Protein 7.1 6.5 - 8.1 g/dL   Albumin 3.8 3.5 - 5.0 g/dL   AST 20 15 - 41 U/L   ALT 13 0 - 44 U/L   Alkaline Phosphatase 67 38 - 126 U/L   Total Bilirubin 0.6 0.0 - 1.2 mg/dL   GFR, Estimated >62 >13 mL/min    Comment: (NOTE) Calculated using the CKD-EPI Creatinine Equation (2021)    Anion gap 10 5 - 15    Comment: Performed at [facility], [address] [place], [place] [address]  CBC     Status: None   Collection Time: 03/13/24  8:42 AM  Result Value Ref Range   WBC 6.2 4.0 - 10.5 K/uL   RBC 4.89 3.87 - 5.11 MIL/uL   Hemoglobin 13.5 12.0 - 15.0 g/dL   HCT 84.6 96.2 - 95.2 %   MCV 83.2 80.0 - 100.0 fL   MCH 27.6 26.0 - 34.0 pg   MCHC 33.2 30.0 - 36.0 g/dL   RDW 84.1 32.4 - 40.1 %   Platelets 309 150 - 400 K/uL   nRBC 0.0 0.0 - 0.2 %    Comment: Performed at [facility], [address] [place], [place] [address]  Urinalysis, Routine w reflex microscopic -Urine, Clean Catch     Status: Abnormal   Collection Time: 03/13/24 10:13 AM  Result Value Ref Range   Color, Urine AMBER (A) YELLOW    Comment: BIOCHEMICALS MAY BE AFFECTED BY COLOR   APPearance CLOUDY (A) CLEAR   Specific Gravity, Urine 1.031 (H) 1.005 - 1.030   pH 5.0 5.0 - 8.0   Glucose, UA NEGATIVE NEGATIVE mg/dL   Hgb urine dipstick NEGATIVE NEGATIVE   Bilirubin Urine NEGATIVE NEGATIVE   Ketones, ur 20 (A) NEGATIVE mg/dL   Protein, ur 30 (A) NEGATIVE mg/dL   Nitrite NEGATIVE NEGATIVE   Leukocytes,Ua NEGATIVE NEGATIVE   RBC / HPF 0-5 0 - 5 RBC/hpf   WBC, UA 0-5 0 - 5 WBC/hpf   Bacteria, UA NONE SEEN NONE SEEN   Squamous Epithelial / HPF 21-50 0 -  5 /HPF   Mucus PRESENT     Comment: Performed at [facility], [address] [place], [place] [address]  POC urine preg, ED  Status: None   Collection Time: 03/13/24 10:17 AM  Result Value Ref Range   Preg Test, Ur Negative Negative  Lactic acid, plasma     Status: None   Collection Time: 03/13/24  6:59 PM  Result Value Ref Range   Lactic Acid, Venous 0.7 0.5 - 1.9 mmol/L    Comment: Performed at [facility], [address] [place], [place] [address]   CT ABDOMEN PELVIS W CONTRAST Result Date: 03/13/2024 CLINICAL DATA:  Acute abdominal pain vomiting, and diarrhea for 2 weeks. Gastritis. EXAM: CT ABDOMEN AND PELVIS WITH CONTRAST TECHNIQUE: Multidetector CT imaging of the abdomen and pelvis was performed using the standard protocol following bolus administration of intravenous contrast. RADIATION DOSE REDUCTION: This exam was performed according to the departmental dose-optimization program which includes automated exposure control, adjustment of the mA and/or kV according to patient size and/or use of iterative reconstruction technique. CONTRAST:  80mL OMNIPAQUE IOHEXOL 300 MG/ML  SOLN COMPARISON:  None Available. FINDINGS: Lower Chest: No acute findings. Hepatobiliary: No suspicious hepatic masses identified. Prior cholecystectomy. No evidence of biliary obstruction. Pancreas:  No mass or inflammatory changes. Spleen: Within normal limits in size and appearance. Adrenals/Urinary Tract: No suspicious masses identified. No evidence of ureteral calculi or hydronephrosis. Stomach/Bowel: Prior gastric bypass surgery noted. A small bowel intussusception measuring approximately 7 cm length is seen in the central abdomen, at the site of the jejuno-jejunostomy anastomosis. No evidence of bowel obstruction, inflammatory process or abnormal fluid collections. Vascular/Lymphatic: No pathologically enlarged lymph nodes. No acute vascular findings. Reproductive:  No mass or other  significant abnormality. Other:  None. Musculoskeletal:  No suspicious bone lesions identified. IMPRESSION: Prior Roux-en-Y gastric bypass surgery, with small bowel intussusception at the site of the jejuno-jejunostomy anastomosis. No evidence of bowel obstruction, perforation, or inflammatory process. Electronically Signed   By: Danae Orleans M.D.   On: 03/13/2024 12:01    Review of Systems  Constitutional:  Negative for chills and fever.  Respiratory:  Negative for cough, chest tightness and shortness of breath.   Genitourinary:  Negative for dysuria.  All other systems reviewed and are negative.   Blood pressure 129/87, pulse 76, temperature 98 F (36.7 C), temperature source Oral, resp. rate 18, last menstrual period 01/28/2023, SpO2 100%. Physical Exam Constitutional:      General: She is not in acute distress.    Appearance: She is well-developed. She is not ill-appearing.  HENT:     Head: Normocephalic and atraumatic.  Cardiovascular:     Rate and Rhythm: Normal rate and regular rhythm.  Pulmonary:     Effort: Pulmonary effort is normal. No respiratory distress.  Abdominal:     Comments: Her abdomen is soft and nondistended.  There is minimal tenderness to deep palpation with no frank peritonitis.  There are no hernias  Skin:    General: Skin is warm and dry.  Neurological:     General: No focal deficit present.     Mental Status: She is alert.  Psychiatric:        Mood and Affect: Mood normal.        Behavior: Behavior normal.      Assessment/Plan Small bowel intussusception  I have reviewed the patient's CAT scan of her abdomen and pelvis and I have discussed this with our bariatric surgeons.  I discussed the diagnosis with the patient.  At this point, she is having no active emesis and her abdominal exam does not show peritonitis.  Her white blood count and  lactic acid level are normal.  I do not believe she requires emergent surgery this evening.  We will keep her  n.p.o. and she will likely undergo a diagnostic laparoscopy tomorrow to determine whether this is a true intussusception or internal hernia.  We will continue supportive care and IV resuscitation over night.  If she acutely worsens, she still could require an emergent laparotomy.  She understands and agrees with the plans.  Moderately complex medical decision making  Abigail Miyamoto, MD 03/13/2024, 9:03 PM

## 2024-03-13 NOTE — ED Notes (Signed)
 Pt ambulatory to bathroom

## 2024-03-14 ENCOUNTER — Inpatient Hospital Stay (HOSPITAL_COMMUNITY): Admitting: Certified Registered"

## 2024-03-14 ENCOUNTER — Encounter (HOSPITAL_COMMUNITY): Admission: EM | Disposition: A | Discharge status: Home / Self Care | Payer: Self-pay | Source: Home / Self Care

## 2024-03-14 ENCOUNTER — Encounter (HOSPITAL_COMMUNITY): Payer: Self-pay | Admitting: Surgery

## 2024-03-14 DIAGNOSIS — K561 Intussusception: Secondary | ICD-10-CM | POA: Diagnosis not present

## 2024-03-14 HISTORY — PX: LAPAROSCOPY: SHX197

## 2024-03-14 LAB — CBC
HCT: 34.2 % — ABNORMAL LOW (ref 36.0–46.0)
Hemoglobin: 11.1 g/dL — ABNORMAL LOW (ref 12.0–15.0)
MCH: 28.2 pg (ref 26.0–34.0)
MCHC: 32.5 g/dL (ref 30.0–36.0)
MCV: 87 fL (ref 80.0–100.0)
Platelets: 256 10*3/uL (ref 150–400)
RBC: 3.93 MIL/uL (ref 3.87–5.11)
RDW: 13.2 % (ref 11.5–15.5)
WBC: 5.6 10*3/uL (ref 4.0–10.5)
nRBC: 0 % (ref 0.0–0.2)

## 2024-03-14 LAB — BASIC METABOLIC PANEL
Anion gap: 8 (ref 5–15)
BUN: 10 mg/dL (ref 6–20)
CO2: 21 mmol/L — ABNORMAL LOW (ref 22–32)
Calcium: 8 mg/dL — ABNORMAL LOW (ref 8.9–10.3)
Chloride: 108 mmol/L (ref 98–111)
Creatinine, Ser: 0.39 mg/dL — ABNORMAL LOW (ref 0.44–1.00)
GFR, Estimated: >60 mL/min (ref >60)
Glucose, Bld: 88 mg/dL (ref 70–99)
Potassium: 3.3 mmol/L — ABNORMAL LOW (ref 3.5–5.1)
Sodium: 137 mmol/L (ref 135–145)

## 2024-03-14 LAB — TYPE AND SCREEN
ABO/RH(D): A POS
Antibody Screen: NEGATIVE

## 2024-03-14 LAB — HIV ANTIBODY (ROUTINE TESTING W REFLEX): HIV Screen 4th Generation wRfx: NONREACTIVE

## 2024-03-14 SURGERY — LAPAROSCOPY, DIAGNOSTIC
Anesthesia: General

## 2024-03-14 MED ORDER — SUGAMMADEX SODIUM 200 MG/2ML IV SOLN
INTRAVENOUS | Status: DC | PRN
  Administered 2024-03-14: 200 mg via INTRAVENOUS

## 2024-03-14 MED ORDER — CHLORHEXIDINE GLUCONATE 0.12 % MT SOLN
15.0000 mL | Freq: Once | OROMUCOSAL | Status: AC
  Administered 2024-03-14: 15 mL via OROMUCOSAL

## 2024-03-14 MED ORDER — SUGAMMADEX SODIUM 200 MG/2ML IV SOLN
INTRAVENOUS | Status: AC
  Filled 2024-03-14: qty 2

## 2024-03-14 MED ORDER — SODIUM CHLORIDE 0.9 % IV SOLN
INTRAVENOUS | Status: AC
  Filled 2024-03-14: qty 2

## 2024-03-14 MED ORDER — LIDOCAINE HCL (PF) 2 % IJ SOLN
INTRAMUSCULAR | Status: AC
  Filled 2024-03-14: qty 10

## 2024-03-14 MED ORDER — ACETAMINOPHEN 500 MG PO TABS: 1000.0000 mg | ORAL_TABLET | Freq: Once | ORAL | Status: AC

## 2024-03-14 MED ORDER — SODIUM CHLORIDE 0.9 % IV SOLN
12.5000 mg | Freq: Four times a day (QID) | INTRAVENOUS | Status: DC | PRN
  Administered 2024-03-14: 12.5 mg via INTRAVENOUS
  Filled 2024-03-14: qty 12.5

## 2024-03-14 MED ORDER — ACETAMINOPHEN 500 MG PO TABS
ORAL_TABLET | ORAL | Status: AC
  Administered 2024-03-14: 1000 mg via ORAL
  Filled 2024-03-14: qty 2

## 2024-03-14 MED ORDER — MIDAZOLAM HCL 5 MG/5ML IJ SOLN
INTRAMUSCULAR | Status: DC | PRN
  Administered 2024-03-14: 2 mg via INTRAVENOUS

## 2024-03-14 MED ORDER — HYDROMORPHONE HCL 1 MG/ML IJ SOLN
0.2500 mg | INTRAMUSCULAR | Status: DC | PRN
Start: 2024-03-14 — End: 2024-03-14
  Administered 2024-03-14 (×4): 0.5 mg via INTRAVENOUS

## 2024-03-14 MED ORDER — FENTANYL CITRATE (PF) 100 MCG/2ML IJ SOLN
INTRAMUSCULAR | Status: DC | PRN
  Administered 2024-03-14: 100 ug via INTRAVENOUS
  Administered 2024-03-14 (×3): 50 ug via INTRAVENOUS

## 2024-03-14 MED ORDER — GABAPENTIN 100 MG PO CAPS
100.0000 mg | ORAL_CAPSULE | Freq: Two times a day (BID) | ORAL | Status: DC
  Administered 2024-03-14 – 2024-03-16 (×5): 100 mg via ORAL
  Filled 2024-03-14 (×5): qty 1

## 2024-03-14 MED ORDER — ACETAMINOPHEN 500 MG PO TABS
1000.0000 mg | ORAL_TABLET | Freq: Three times a day (TID) | ORAL | Status: DC
  Administered 2024-03-15: 1000 mg via ORAL
  Filled 2024-03-14 (×3): qty 2

## 2024-03-14 MED ORDER — BUPIVACAINE-EPINEPHRINE (PF) 0.25% -1:200000 IJ SOLN
INTRAMUSCULAR | Status: AC
  Filled 2024-03-14: qty 30

## 2024-03-14 MED ORDER — ONDANSETRON HCL 4 MG/2ML IJ SOLN
INTRAMUSCULAR | Status: DC | PRN
  Administered 2024-03-14: 4 mg via INTRAVENOUS

## 2024-03-14 MED ORDER — LACTATED RINGERS IV SOLN: INTRAVENOUS | Status: DC | PRN

## 2024-03-14 MED ORDER — MEPERIDINE HCL 50 MG/ML IJ SOLN: 6.2500 mg | INTRAMUSCULAR | Status: DC | PRN

## 2024-03-14 MED ORDER — POTASSIUM CHLORIDE 10 MEQ/100ML IV SOLN: 10.0000 meq | INTRAVENOUS | Status: AC

## 2024-03-14 MED ORDER — SUCCINYLCHOLINE CHLORIDE 200 MG/10ML IV SOSY
PREFILLED_SYRINGE | INTRAVENOUS | Status: DC | PRN
  Administered 2024-03-14: 100 mg via INTRAVENOUS

## 2024-03-14 MED ORDER — ACETAMINOPHEN 160 MG/5ML PO SOLN
1000.0000 mg | Freq: Three times a day (TID) | ORAL | Status: DC
  Administered 2024-03-15: 1000 mg via ORAL
  Filled 2024-03-14 (×3): qty 40.6

## 2024-03-14 MED ORDER — FENTANYL CITRATE (PF) 250 MCG/5ML IJ SOLN
INTRAMUSCULAR | Status: AC
  Filled 2024-03-14: qty 5

## 2024-03-14 MED ORDER — OXYCODONE HCL 5 MG PO TABS: 5.0000 mg | ORAL_TABLET | Freq: Once | ORAL | Status: DC | PRN

## 2024-03-14 MED ORDER — HYDROMORPHONE HCL 1 MG/ML IJ SOLN
INTRAMUSCULAR | Status: AC
  Filled 2024-03-14: qty 1

## 2024-03-14 MED ORDER — AMISULPRIDE (ANTIEMETIC) 5 MG/2ML IV SOLN: 10.0000 mg | Freq: Once | INTRAVENOUS | Status: DC | PRN

## 2024-03-14 MED ORDER — ROCURONIUM BROMIDE 10 MG/ML (PF) SYRINGE
PREFILLED_SYRINGE | INTRAVENOUS | Status: AC
  Filled 2024-03-14: qty 20

## 2024-03-14 MED ORDER — MIDAZOLAM HCL 2 MG/2ML IJ SOLN
INTRAMUSCULAR | Status: AC
  Filled 2024-03-14: qty 2

## 2024-03-14 MED ORDER — SIMETHICONE 80 MG PO CHEW
80.0000 mg | CHEWABLE_TABLET | Freq: Four times a day (QID) | ORAL | Status: DC | PRN
  Administered 2024-03-14: 80 mg via ORAL
  Filled 2024-03-14 (×3): qty 1

## 2024-03-14 MED ORDER — ENSURE MAX PROTEIN PO LIQD
2.0000 [oz_av] | ORAL | Status: DC
  Administered 2024-03-15 – 2024-03-16 (×7): 2 [oz_av] via ORAL

## 2024-03-14 MED ORDER — DEXAMETHASONE SODIUM PHOSPHATE 10 MG/ML IJ SOLN
INTRAMUSCULAR | Status: AC
  Filled 2024-03-14: qty 1

## 2024-03-14 MED ORDER — ACETAMINOPHEN 10 MG/ML IV SOLN
1000.0000 mg | Freq: Once | INTRAVENOUS | Status: AC
  Administered 2024-03-14: 1000 mg via INTRAVENOUS

## 2024-03-14 MED ORDER — SODIUM CHLORIDE 0.9 % IV SOLN
2.0000 g | INTRAVENOUS | Status: AC
  Administered 2024-03-14: 2 g via INTRAVENOUS

## 2024-03-14 MED ORDER — ONDANSETRON HCL 4 MG/2ML IJ SOLN
INTRAMUSCULAR | Status: AC
  Filled 2024-03-14: qty 2

## 2024-03-14 MED ORDER — VISTASEAL 4 ML SINGLE DOSE KIT
PACK | CUTANEOUS | Status: DC | PRN
  Administered 2024-03-14: 4 mL via TOPICAL

## 2024-03-14 MED ORDER — ROCURONIUM BROMIDE 100 MG/10ML IV SOLN
INTRAVENOUS | Status: DC | PRN
  Administered 2024-03-14: 50 mg via INTRAVENOUS
  Administered 2024-03-14: 20 mg via INTRAVENOUS

## 2024-03-14 MED ORDER — OXYCODONE HCL 5 MG/5ML PO SOLN
5.0000 mg | Freq: Four times a day (QID) | ORAL | Status: DC | PRN
  Administered 2024-03-14 (×2): 5 mg via ORAL
  Filled 2024-03-14 (×2): qty 5

## 2024-03-14 MED ORDER — LACTATED RINGERS IR SOLN
Status: DC | PRN
  Administered 2024-03-14: 1000 mL

## 2024-03-14 MED ORDER — SUCCINYLCHOLINE CHLORIDE 200 MG/10ML IV SOSY
PREFILLED_SYRINGE | INTRAVENOUS | Status: AC
  Filled 2024-03-14: qty 10

## 2024-03-14 MED ORDER — BUPIVACAINE-EPINEPHRINE (PF) 0.25% -1:200000 IJ SOLN
INTRAMUSCULAR | Status: AC
Start: 2024-03-14 — End: ?
  Filled 2024-03-14: qty 30

## 2024-03-14 MED ORDER — SODIUM CHLORIDE 0.9 % IV SOLN: 12.5000 mg | INTRAVENOUS | Status: DC | PRN

## 2024-03-14 MED ORDER — PROPOFOL 10 MG/ML IV BOLUS
INTRAVENOUS | Status: DC | PRN
  Administered 2024-03-14: 150 mg via INTRAVENOUS

## 2024-03-14 MED ORDER — ACETAMINOPHEN 10 MG/ML IV SOLN
INTRAVENOUS | Status: AC
  Filled 2024-03-14: qty 100

## 2024-03-14 MED ORDER — FENTANYL CITRATE PF 50 MCG/ML IJ SOSY
PREFILLED_SYRINGE | INTRAMUSCULAR | Status: AC
  Filled 2024-03-14: qty 3

## 2024-03-14 MED ORDER — PHENYLEPHRINE HCL (PRESSORS) 10 MG/ML IV SOLN
INTRAVENOUS | Status: DC | PRN
  Administered 2024-03-14 (×2): 80 ug via INTRAVENOUS

## 2024-03-14 MED ORDER — BUPIVACAINE-EPINEPHRINE 0.25% -1:200000 IJ SOLN
INTRAMUSCULAR | Status: DC | PRN
  Administered 2024-03-14: 60 mL

## 2024-03-14 MED ORDER — LACTATED RINGERS IV SOLN: INTRAVENOUS | Status: DC

## 2024-03-14 MED ORDER — ORAL CARE MOUTH RINSE: 15.0000 mL | Freq: Once | OROMUCOSAL | Status: AC

## 2024-03-14 MED ORDER — HYDROMORPHONE HCL 1 MG/ML IJ SOLN
INTRAMUSCULAR | Status: AC
Start: 2024-03-14 — End: 2024-03-15
  Filled 2024-03-14: qty 1

## 2024-03-14 MED ORDER — LIDOCAINE HCL (CARDIAC) PF 100 MG/5ML IV SOSY
PREFILLED_SYRINGE | INTRAVENOUS | Status: DC | PRN
  Administered 2024-03-14: 80 mg via INTRAVENOUS

## 2024-03-14 MED ORDER — DEXAMETHASONE SODIUM PHOSPHATE 4 MG/ML IJ SOLN
INTRAMUSCULAR | Status: DC | PRN
  Administered 2024-03-14: 5 mg via INTRAVENOUS

## 2024-03-14 MED ORDER — FENTANYL CITRATE PF 50 MCG/ML IJ SOSY
25.0000 ug | PREFILLED_SYRINGE | INTRAMUSCULAR | Status: DC | PRN
  Administered 2024-03-14 (×3): 50 ug via INTRAVENOUS

## 2024-03-14 MED ORDER — VISTASEAL 4 ML SINGLE DOSE KIT
4.0000 mL | PACK | Freq: Once | CUTANEOUS | Status: DC
  Filled 2024-03-14: qty 4

## 2024-03-14 MED ORDER — OXYCODONE HCL 5 MG/5ML PO SOLN: 5.0000 mg | Freq: Once | ORAL | Status: DC | PRN

## 2024-03-14 MED ORDER — 0.9 % SODIUM CHLORIDE (POUR BTL) OPTIME
TOPICAL | Status: DC | PRN
  Administered 2024-03-14: 1000 mL

## 2024-03-14 MED ORDER — ENOXAPARIN SODIUM 40 MG/0.4ML IJ SOSY
40.0000 mg | PREFILLED_SYRINGE | INTRAMUSCULAR | Status: DC
  Administered 2024-03-15 – 2024-03-16 (×2): 40 mg via SUBCUTANEOUS
  Filled 2024-03-14 (×2): qty 0.4

## 2024-03-14 SURGICAL SUPPLY — 77 items
ANTIFOG SOL W/FOAM PAD STRL (MISCELLANEOUS) ×1 IMPLANT
APPLICATOR COTTON TIP 6 STRL (MISCELLANEOUS) IMPLANT
APPLICATOR COTTON TIP 6IN STRL (MISCELLANEOUS) IMPLANT
APPLICATOR VISTASEAL 35 (MISCELLANEOUS) ×2 IMPLANT
APPLIER CLIP ROT 13.4 12 LRG (CLIP) IMPLANT
BAG COUNTER SPONGE SURGICOUNT (BAG) IMPLANT
BLADE SURG SZ11 CARB STEEL (BLADE) ×1 IMPLANT
CABLE HIGH FREQUENCY MONO STRZ (ELECTRODE) IMPLANT
CHLORAPREP W/TINT 26 (MISCELLANEOUS) ×2 IMPLANT
CLIP APPLIE ROT 13.4 12 LRG (CLIP) IMPLANT
CLIP SUT LAPRA TY ABSORB (SUTURE) ×2 IMPLANT
CUTTER FLEX LINEAR 45M (STAPLE) IMPLANT
DERMABOND ADVANCED .7 DNX6 (GAUZE/BANDAGES/DRESSINGS) IMPLANT
DEVICE SUT QUICK LOAD TK 5 (SUTURE) IMPLANT
DEVICE SUT TI-KNOT TK 5X26 (SUTURE) IMPLANT
DEVICE SUTURE ENDOST 10MM (ENDOMECHANICALS) ×1 IMPLANT
DRAIN PENROSE 0.25X18 (DRAIN) ×1 IMPLANT
DRAPE SHEET LG 3/4 BI-LAMINATE (DRAPES) IMPLANT
DRSG TEGADERM 2-3/8X2-3/4 SM (GAUZE/BANDAGES/DRESSINGS) ×6 IMPLANT
ELECT REM PT RETURN 15FT ADLT (MISCELLANEOUS) ×1 IMPLANT
GAUZE 4X4 16PLY ~~LOC~~+RFID DBL (SPONGE) ×1 IMPLANT
GAUZE SPONGE 2X2 8PLY STRL LF (GAUZE/BANDAGES/DRESSINGS) ×1 IMPLANT
GAUZE SPONGE 4X4 12PLY STRL (GAUZE/BANDAGES/DRESSINGS) IMPLANT
GLOVE BIO SURGEON STRL SZ7.5 (GLOVE) ×1 IMPLANT
GLOVE INDICATOR 8.0 STRL GRN (GLOVE) ×1 IMPLANT
GOWN STRL REUS W/ TWL XL LVL3 (GOWN DISPOSABLE) ×4 IMPLANT
IRRIG SUCT STRYKERFLOW 2 WTIP (MISCELLANEOUS) ×1 IMPLANT
IRRIGATION SUCT STRKRFLW 2 WTP (MISCELLANEOUS) ×1 IMPLANT
KIT BASIN OR (CUSTOM PROCEDURE TRAY) ×1 IMPLANT
KIT GASTRIC LAVAGE 34FR ADT (SET/KITS/TRAYS/PACK) ×1 IMPLANT
KIT TURNOVER KIT A (KITS) IMPLANT
MARKER SKIN DUAL TIP RULER LAB (MISCELLANEOUS) ×1 IMPLANT
MAT PREVALON FULL STRYKER (MISCELLANEOUS) ×1 IMPLANT
NDL SPNL 22GX3.5 QUINCKE BK (NEEDLE) ×1 IMPLANT
NEEDLE SPNL 22GX3.5 QUINCKE BK (NEEDLE) ×1 IMPLANT
PACK CARDIOVASCULAR III (CUSTOM PROCEDURE TRAY) ×1 IMPLANT
RELOAD 45 VASCULAR/THIN (ENDOMECHANICALS) IMPLANT
RELOAD STAPLE 45 2.5 WHT GRN (ENDOMECHANICALS) IMPLANT
RELOAD STAPLE 45 3.5 BLU ETS (ENDOMECHANICALS) IMPLANT
RELOAD STAPLE 60 2.6 WHT THN (STAPLE) ×2 IMPLANT
RELOAD STAPLE 60 3.6 BLU REG (STAPLE) ×2 IMPLANT
RELOAD STAPLE 60 3.8 GOLD REG (STAPLE) ×1 IMPLANT
RELOAD STAPLE TA45 3.5 REG BLU (ENDOMECHANICALS) IMPLANT
RELOAD STAPLER BLUE 60MM (STAPLE) IMPLANT
RELOAD STAPLER GOLD 60MM (STAPLE) IMPLANT
RELOAD STAPLER WHITE 60MM (STAPLE) ×2 IMPLANT
RELOAD SUT SNGL STCH ABSRB 2-0 (ENDOMECHANICALS) ×5 IMPLANT
RELOAD SUT SNGL STCH BLK 2-0 (ENDOMECHANICALS) ×4 IMPLANT
SCISSORS LAP 5X45 EPIX DISP (ENDOMECHANICALS) ×1 IMPLANT
SET TUBE SMOKE EVAC HIGH FLOW (TUBING) ×1 IMPLANT
SHEARS HARMONIC 45 ACE (MISCELLANEOUS) ×1 IMPLANT
SLEEVE ADV FIXATION 12X100MM (TROCAR) ×2 IMPLANT
SLEEVE ADV FIXATION 5X100MM (TROCAR) IMPLANT
SOLUTION ANTFG W/FOAM PAD STRL (MISCELLANEOUS) ×1 IMPLANT
STAPLER ECHELON BIOABSB 60 FLE (MISCELLANEOUS) IMPLANT
STAPLER ECHELON LONG 60 440 (INSTRUMENTS) ×1 IMPLANT
STAPLER RELOAD BLUE 60MM (STAPLE) IMPLANT
STAPLER RELOAD GOLD 60MM (STAPLE) IMPLANT
STAPLER RELOAD WHITE 60MM (STAPLE) ×2 IMPLANT
STRIP CLOSURE SKIN 1/2X4 (GAUZE/BANDAGES/DRESSINGS) ×1 IMPLANT
SURGILUBE 2OZ TUBE FLIPTOP (MISCELLANEOUS) ×1 IMPLANT
SUT MNCRL AB 4-0 PS2 18 (SUTURE) ×1 IMPLANT
SUT RELOAD ENDO STITCH 2 48X1 (ENDOMECHANICALS) ×4 IMPLANT
SUT RELOAD ENDO STITCH 2.0 (ENDOMECHANICALS) ×4 IMPLANT
SUT SURGIDAC NAB ES-9 0 48 120 (SUTURE) IMPLANT
SUT VIC AB 2-0 SH 27X BRD (SUTURE) ×1 IMPLANT
SUTURE RELOAD END STTCH 2 48X1 (ENDOMECHANICALS) ×4 IMPLANT
SUTURE RELOAD ENDO STITCH 2.0 (ENDOMECHANICALS) ×4 IMPLANT
SYR 20ML LL LF (SYRINGE) ×2 IMPLANT
SYS BAG RETRIEVAL 10MM (BASKET) ×1 IMPLANT
SYSTEM BAG RETRIEVAL 10MM (BASKET) IMPLANT
TOWEL OR 17X26 10 PK STRL BLUE (TOWEL DISPOSABLE) ×1 IMPLANT
TRAY FOLEY MTR SLVR 16FR STAT (SET/KITS/TRAYS/PACK) IMPLANT
TROCAR ADV FIXATION 12X100MM (TROCAR) ×1 IMPLANT
TROCAR ADV FIXATION 5X100MM (TROCAR) ×1 IMPLANT
TROCAR XCEL NON-BLD 5MMX100MML (ENDOMECHANICALS) ×1 IMPLANT
TUBING CONNECTING 10 (TUBING) ×2 IMPLANT

## 2024-03-14 NOTE — Anesthesia Procedure Notes (Signed)
 Procedure Name: Intubation Date/Time: 03/14/2024 10:34 AM  Performed by: Deri Fuelling, CRNAPre-anesthesia Checklist: Patient identified, Emergency Drugs available, Suction available and Patient being monitored Patient Re-evaluated:Patient Re-evaluated prior to induction Oxygen Delivery Method: Circle system utilized Preoxygenation: Pre-oxygenation with 100% oxygen Induction Type: IV induction Ventilation: Mask ventilation without difficulty Laryngoscope Size: Mac and 4 Grade View: Grade I Tube type: Oral Tube size: 7.0 mm Number of attempts: 1 Airway Equipment and Method: Stylet and Oral airway Placement Confirmation: ETT inserted through vocal cords under direct vision, positive ETCO2 and breath sounds checked- equal and bilateral Secured at: 21 cm Tube secured with: Tape Dental Injury: Teeth and Oropharynx as per pre-operative assessment

## 2024-03-14 NOTE — Op Note (Signed)
 03/14/2024  12:32 PM  PATIENT:  Ashley Paul  53 y.o. 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  POST-OPERATIVE DIAGNOSIS: Same plus small Petersons hernia defect  PROCEDURE:  Procedure(s): LAPAROSCOPY, DIAGNOSTIC  Laparosocpic small bowel resection (jejunojejunostomy) with creation of small bowel anastomosis x 2; Laparoscopic closure of internal hernia  SURGEON:  Surgeon(s):  [name], MD  ASSISTANTS: Berna Bue, MD (A qualified assistant was needed to help with tissue retraction & manipulation and anatomical alignment)  ANESTHESIA:   general  DRAINS: none   LOCAL MEDICATIONS USED:  BUPIVICAINE   SPECIMEN:  Source of Specimen:  Old jejunojejunostomy  DISPOSITION OF SPECIMEN:  PATHOLOGY  COUNTS:  YES  INDICATION FOR PROCEDURE: Patient is a 53 year old female who underwent 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  She recently started having abdominal pain and nausea with diarrhea.  She initially presented to the ER last week and had lab evaluation but no imaging and was sent out.  Her symptoms persisted so she Leander Rams presented to the ER.  On the CT scan she was found to have evidence of an intussusception at her jejunojejunostomy without evidence of bowel obstruction.  No leukocytosis no bowel wall thickening no tachycardia.  She was admitted overnight and placed on bowel rest by the general surgeon.  He had discussed the case with a bariatric surgeon that night.  Patient was seen and examined in the preop holding area.  Based on her CT scan it looks like she had intussusception at her jejunojejunostomy and there is also potential evidence of an internal hernia due to how the jejunojejunostomy was positioned in the abdomen.  I recommended diagnostic laparoscopy.  We discussed potential exploratory laparotomy.  We discussed that sometimes the  intussusception will be reduced at the time of surgery but there is a moderate risk of recurrence and that reduction alone as a moderate to high risk of recurrence.  We discussed potential reduction and plication as well as resection of the old anastomosis with recreation of 2 new anastomoses.  We discussed the pros and cons of all that which are separately documented.  Patient preferred to have a more definitive procedure.  PROCEDURE: After obtaining informed consent the patient was taken the OR to at [facility] and placed supine on the operating room table.  General endotracheal anesthesia was established.  Sequential compression devices were placed.  Her abdomen was prepped and draped in the usual standard surgical fashion with ChloraPrep.  She received IV antibiotic prior to skin incision.  A surgical timeout was performed.  Access was obtained at the infraumbilical position using an open Hassan technique.  Skin was incised.  The fascia was carefully incised and lifted up with Coker's.  The abdominal cavity was entered.  A pursestring suture of 0 Vicryl was placed around the fascial edges followed by 12 mm Hassan trocar.    Pneumoperitoneum was smoothly established up to a patient pressure of 15 mmHg without any change in patient vitals.  The laparoscope was advanced and the abdominal cavity was surveilled.  There was no evidence of injury to surrounding structures.  There was evidence of an omental adhesion around the umbilical area.  Additional trocars were placed.  A 5 mm trocar to the left of the umbilicus, a 5 mm trocar in the right lateral abdominal wall, a 12 mm trocar in the right mid abdomen and an additional assistant trocar  in the left lateral abdominal wall.  We also placed a 5 mm trocar in the left lateral abdominal wall as a second port for the assistant.  A bilateral laparoscopic tap block was performed for postoperative pain relief.   Gross inspection of the bowel was performed.  We  identified the cecum and terminal ileum.  Then using atraumatic bowel graspers we ran the entire small bowel starting at the terminal ileum back proximally toward the ligament of Treitz.  The distal bowel AKA the common channel appeared normal.  The jejunojejunostomy was encountered.  It was dilated like you would typically see after period of time.  There was no evidence of intussusception at the time of the laparoscopy.  We then ran the Roux limb from the jejunojejunostomy up toward the gastric pouch.  Patient had good length on her Roux limb and there was no pathology in the Roux limb.  The patient did have a little bit of a long candycane limb-blind end of the Roux limb but it was not dilated.  We then ran the Roux limb back down to the jejunojejunostomy.  Again it appeared normal.  We then identified the BP limb and ran it back proximally to the ligament of Treitz.  BP limb was about 40 cm long and had some dilatation to it.  We then turned our attention to [employer].  The majority of it was closed however there was a small gap at the base.  We decided to close that.  A Endo Stitch with a 2-0 silk was used to reapproximate the Roux limb mesentery to the transverse colon mesentery for about 2 inches and tied down.  The rest of the space was already closed chronically.  Even though there was no gross evidence of intussusception at the time of laparoscopy I felt the patient was at high risk for recurrence so after discussion with Dr. Fredricka Bonine I elected to proceed with resecting her jejunojejunostomy.  We did this in a stepwise fashion in order to prevent any type of confusion about the intestinal limbs.      I first divided the Roux limb just proximal to the jejunojejunostomy with an Echelon 60 stapler with a white load.  Took down a little bit of the mesentery with harmonic scalpel.  In order to minimize confusion I went ahead and reconnected the Roux limb distal to the old jejunojejunostomy about 5 cm below  before dividing the BP limb.  A side-to-side functional end-to-end stapled anastomosis was created between the Roux limb and the common channel.  Enterotomy was made in the Roux limb and in the common channel with harmonic scalpel.  An Echelon 60 stapler with a white load was placed through each of the enterotomies brought together and fired to create a common channel. The common enterotomy was closed with a running 2-0 Vicryl endostitch begun at either end of the enterotomy and tied centrally. The mesenteric defect was then closed with running 2-0 silk using an endostitch.     At this point, I then completely excised the old jejunojejunostomy by dividing the biliopancreatic limb with another fire of the Echelon 60 stapler with a white load.  This completely freed the old jejunojejunostomy it was placed in the left upper quadrant.  I then with the aid of the assistant reconnected the biliopancreatic limb to the common channel about 10 cm distal to the first new anastomosis (roux en y/alimentary limb to common channel).  This was also done in a side-to-side functional  end-to-end manner. This was accomplished with a single firing of the 60 mm white load linear stapler after making enterotomies with harmonic scalpel. The common enterotomy was closed with a running 2-0 Vicryl begun at either end of the enterotomy and tied centrally. The mesenteric defect was then closed with running 2-0 silk.  I then reran the bowel again.  There is no evidence of enterotomy.  There was no evidence of bleeding.  There was good hemostasis.  I am reinspected both new anastomoses-the Roux limb to the common channel and the BP limb about 10 cm distal to that to the common channel.  I reinspected both mesenteric closures and each of those anastomoses and there was no gap. Vistaseal tissue sealant was placed over both the anastomosis.  I reinspected Petersons space and that appeared closed.  We then placed the old jejunojejunostomy and a  specimen bag and brought it out from the umbilical trocar.  I removed the 12 mm trocar from the right mid abdomen and closed the fascial defect with a 0 Vicryl using PMI suture passer with laparoscopic guidance..  I tied down the umbilical 0 Vicryl I did elect to place an additional 0 Vicryl using PMI suture passer at the umbilical fascia.  Local was infiltrated.  Pneumoperitoneum was released.  Trocars were removed.  Skin incisions were closed with Monocryl 4 oh in a subcuticular space.  Dermabond was applied.  All needle, instrument, and sponge counts were correct x 2.  There were no immediate complications.  The patient was extubated and taken recovery in stable condition  PLAN OF CARE:  Already inpatient  PATIENT DISPOSITION:  PACU - hemodynamically stable.   Delay start of Pharmacological VTE agent (>24hrs) due to surgical blood loss or risk of bleeding:  no  Mary Sella. Andrey Campanile, MD, FACS General, Bariatric, & Minimally Invasive Surgery [facility], [place]

## 2024-03-14 NOTE — Interval H&P Note (Signed)
 History and Physical Interval Note:  03/14/2024 9:41 AM  Ashley Paul  has presented today for surgery, with the diagnosis of intussusception with histrory of gastric bypass.  The various methods of treatment have been discussed with the patient and family. After consideration of risks, benefits and other options for treatment, the patient has consented to  Procedure(s) with comments: LAPAROSCOPY, DIAGNOSTIC (N/A) - Diagnotic Laparoscopy with possible small bowel resection with intussusception with HX of gastric bypass as a surgical intervention.  The patient's history has been reviewed, patient examined, no change in status, stable for surgery.  I have reviewed the patient's chart and labs.  Questions were answered to the patient's satisfaction.    I reviewed the patient's chart including labs and imaging.  She has been taking her multivitamin.  On review of her CT scan there is a deception involving her jejunojejunostomy.  I am also concerned there may be an internal hernia as well given the location of her jejunojejunostomy as well as the cluster of bowel in the left upper quadrant.  I recommended diagnostic laparoscopy potential ex lap.  We discussed that with an intussusception that reduction alone there is a significant risk of recurrence.  We discussed other options including reduction with plication which also can have a moderate risk of recurrence.  We discussed that the definitive treatment option in the case of intussusception after gastric bypass is typically resection of the jejunojejunostomy with creation of 2 new anastomosis one of her Roux limb to the common channel and the BP limb to the common channel.  We discussed even with this there is no risk of recurrence of intussusception.  We also discussed the risk of internal hernia since we have 2 defects that will need to be closed.  We also need to rule out a Petersons defect as well.  We discussed the risk of the surgery including but not  limited to bleeding, infection, need to convert to an open procedure, anastomotic leak, anastomotic stricture, intussusception recurrence, internal hernia, DVT, cardiac and pulmonary events, incisional hernia we have to convert to open.  We discussed typical postoperative recovery.  All of her questions were asked and answered.  She wishes to proceed with surgery.  She prefers to have resection and reconstruction as opposed to just reduction and plication because of the risk of recurrence  Ashley Paul

## 2024-03-14 NOTE — Plan of Care (Signed)
   Problem: Education: Goal: Knowledge of General Education information will improve Description Including pain rating scale, medication(s)/side effects and non-pharmacologic comfort measures Outcome: Progressing   Problem: Health Behavior/Discharge Planning: Goal: Ability to manage health-related needs will improve Outcome: Progressing

## 2024-03-14 NOTE — Anesthesia Preprocedure Evaluation (Signed)
 Anesthesia Evaluation  Patient identified by MRN, date of birth, ID band Patient awake    Reviewed: Allergy & Precautions, NPO status , Patient's Chart, lab work & pertinent test results  Airway Mallampati: II  TM Distance: >3 FB Neck ROM: Full    Dental no notable dental hx.    Pulmonary neg pulmonary ROS, former smoker   Pulmonary exam normal breath sounds clear to auscultation       Cardiovascular hypertension, Pt. on medications Normal cardiovascular exam Rhythm:Regular Rate:Normal     Neuro/Psych   Anxiety Depression    negative neurological ROS  negative psych ROS   GI/Hepatic Neg liver ROS,GERD  ,,  Endo/Other    Renal/GU negative Renal ROS  negative genitourinary   Musculoskeletal  (+) Arthritis , Osteoarthritis,    Abdominal   Peds negative pediatric ROS (+)  Hematology negative hematology ROS (+)   Anesthesia Other Findings   Reproductive/Obstetrics negative OB ROS                             Anesthesia Physical Anesthesia Plan  ASA: 2  Anesthesia Plan: General   Post-op Pain Management:    Induction: Intravenous, Rapid sequence and Cricoid pressure planned  PONV Risk Score and Plan: 3 and Midazolam, Dexamethasone, Ondansetron and Treatment may vary due to age or medical condition  Airway Management Planned: Oral ETT  Additional Equipment:   Intra-op Plan:   Post-operative Plan: Extubation in OR  Informed Consent: I have reviewed the patients History and Physical, chart, labs and discussed the procedure including the risks, benefits and alternatives for the proposed anesthesia with the patient or authorized representative who has indicated his/her understanding and acceptance.     Dental advisory given  Plan Discussed with: CRNA and Surgeon  Anesthesia Plan Comments:         Anesthesia Quick Evaluation

## 2024-03-14 NOTE — Transfer of Care (Signed)
 Immediate Anesthesia Transfer of Care Note  Patient: Ashley Paul  Procedure(s) Performed: LAPAROSCOPY, DIAGNOSTIC  Laparosocpic smallbowel resection ; Laparoscopic closure of internal hernia  Patient Location: PACU  Anesthesia Type:General  Level of Consciousness: awake and alert   Airway & Oxygen Therapy: Patient Spontanous Breathing and Patient connected to face mask oxygen  Post-op Assessment: Report given to RN and Post -op Vital signs reviewed and stable  Post vital signs: Reviewed and stable  Last Vitals:  Vitals Value Taken Time  BP 136/82 03/14/24 1230  Temp    Pulse 90 03/14/24 1231  Resp 18 03/14/24 1231  SpO2 100 % 03/14/24 1231  Vitals shown include unfiled device data.  Last Pain:  Vitals:   03/14/24 0937  TempSrc:   PainSc: 0-No pain      Patients Stated Pain Goal: 3 (03/14/24 4098)  Complications: No notable events documented.

## 2024-03-15 ENCOUNTER — Encounter (HOSPITAL_COMMUNITY): Payer: Self-pay | Admitting: General Surgery

## 2024-03-15 ENCOUNTER — Other Ambulatory Visit: Payer: Self-pay

## 2024-03-15 LAB — CBC WITH DIFFERENTIAL/PLATELET
Abs Immature Granulocytes: 0.03 10*3/uL (ref 0.00–0.07)
Basophils Absolute: 0.1 10*3/uL (ref 0.0–0.1)
Basophils Relative: 1 %
Eosinophils Absolute: 0.3 10*3/uL (ref 0.0–0.5)
Eosinophils Relative: 4 %
HCT: 35.4 % — ABNORMAL LOW (ref 36.0–46.0)
Hemoglobin: 11.8 g/dL — ABNORMAL LOW (ref 12.0–15.0)
Immature Granulocytes: 0 %
Lymphocytes Relative: 24 %
Lymphs Abs: 1.7 10*3/uL (ref 0.7–4.0)
MCH: 28.6 pg (ref 26.0–34.0)
MCHC: 33.3 g/dL (ref 30.0–36.0)
MCV: 85.7 fL (ref 80.0–100.0)
Monocytes Absolute: 1 10*3/uL (ref 0.1–1.0)
Monocytes Relative: 14 %
Neutro Abs: 4 10*3/uL (ref 1.7–7.7)
Neutrophils Relative %: 57 %
Platelets: 259 10*3/uL (ref 150–400)
RBC: 4.13 MIL/uL (ref 3.87–5.11)
RDW: 13.5 % (ref 11.5–15.5)
Smear Review: NORMAL
WBC: 7.1 10*3/uL (ref 4.0–10.5)
nRBC: 0 % (ref 0.0–0.2)

## 2024-03-15 LAB — COMPREHENSIVE METABOLIC PANEL
ALT: 11 U/L (ref 0–44)
AST: 20 U/L (ref 15–41)
Albumin: 2.9 g/dL — ABNORMAL LOW (ref 3.5–5.0)
Alkaline Phosphatase: 59 U/L (ref 38–126)
Anion gap: 8 (ref 5–15)
BUN: 5 mg/dL — ABNORMAL LOW (ref 6–20)
CO2: 22 mmol/L (ref 22–32)
Calcium: 8.3 mg/dL — ABNORMAL LOW (ref 8.9–10.3)
Chloride: 110 mmol/L (ref 98–111)
Creatinine, Ser: 0.47 mg/dL (ref 0.44–1.00)
GFR, Estimated: >60 mL/min (ref >60)
Glucose, Bld: 106 mg/dL — ABNORMAL HIGH (ref 70–99)
Potassium: 3.7 mmol/L (ref 3.5–5.1)
Sodium: 140 mmol/L (ref 135–145)
Total Bilirubin: 0.5 mg/dL (ref 0.0–1.2)
Total Protein: 5.5 g/dL — ABNORMAL LOW (ref 6.5–8.1)

## 2024-03-15 MED ORDER — METHOCARBAMOL 500 MG PO TABS
750.0000 mg | ORAL_TABLET | Freq: Four times a day (QID) | ORAL | Status: DC
  Administered 2024-03-15: 750 mg via ORAL
  Filled 2024-03-15 (×2): qty 2

## 2024-03-15 MED ORDER — SODIUM CHLORIDE 0.9 % IV SOLN: INTRAVENOUS | Status: DC

## 2024-03-15 MED ORDER — OXYCODONE HCL 5 MG/5ML PO SOLN
5.0000 mg | Freq: Four times a day (QID) | ORAL | Status: DC | PRN
  Administered 2024-03-15 – 2024-03-16 (×2): 10 mg via ORAL
  Filled 2024-03-15 (×5): qty 10

## 2024-03-15 MED ORDER — POLYETHYLENE GLYCOL 3350 17 G PO PACK: 17.0000 g | PACK | Freq: Every day | ORAL | Status: DC | PRN

## 2024-03-15 NOTE — Progress Notes (Addendum)
 1 Day Post-Op  Subjective: CC: Patient reports generalized abdominal pain overnight that does not feel well-controlled.  She reports she is on hydrocodone and tramadol at baseline.  She does have ongoing nausea throughout the night that is not necessarily worse with p.o. intake.  She is taking small amounts of CLD and has been able to drink 6 ounces of an Ensure max shake since surgery.  She had a liquid BM overnight.  Voiding without issues.  Mobilizing.  Afebrile. No tachycardia or hypotension. WBC wnl. Cr wnl. K 3.7.   Objective: Vital signs in last 24 hours: Temp:  [97.9 F (36.6 C)-98.8 F (37.1 C)] 98.2 F (36.8 C) (03/19 0907) Pulse Rate:  [78-95] 84 (03/19 0907) Resp:  [11-18] 14 (03/19 0907) BP: (123-136)/(76-91) 130/85 (03/19 0907) SpO2:  [97 %-100 %] 100 % (03/19 0907) Weight:  [69.4 kg] 69.4 kg (03/18 0937) Last BM Date : 03/14/24  Intake/Output from previous day: 03/18 0701 - 03/19 0700 In: 2615.3 [P.O.:840; I.V.:1525.3; IV Piggyback:250] Out: 970 [Urine:950; Blood:20] Intake/Output this shift: Total I/O In: 60 [P.O.:60] Out: 0   PE: Gen:  Alert, NAD, pleasant Card:  Reg Pulm:  CTAB, no W/R/R, effort normal Abd: Soft, mild distension, generalized ttp that is greatest in her upper abdomen. No rigidity or guarding. +BS. Incisions with glue intact appears well and are without drainage, bleeding, or signs of infection.  Ext:  No LE edema  Psych: A&Ox3   Lab Results:  Recent Labs    03/14/24 0432 03/15/24 0509  WBC 5.6 7.1  HGB 11.1* 11.8*  HCT 34.2* 35.4*  PLT 256 259   BMET Recent Labs    03/14/24 0432 03/15/24 0509  NA 137 140  K 3.3* 3.7  CL 108 110  CO2 21* 22  GLUCOSE 88 106*  BUN 10 5*  CREATININE 0.39* 0.47  CALCIUM 8.0* 8.3*   PT/INR No results for input(s): "LABPROT", "INR" in the last 72 hours. CMP     Component Value Date/Time   NA 140 03/15/2024 0509   NA 140 07/25/2014 2311   K 3.7 03/15/2024 0509   K 3.5 07/25/2014 2311    CL 110 03/15/2024 0509   CL 106 07/25/2014 2311   CO2 22 03/15/2024 0509   CO2 25 07/25/2014 2311   GLUCOSE 106 (H) 03/15/2024 0509   GLUCOSE 145 (H) 07/25/2014 2311   BUN 5 (L) 03/15/2024 0509   BUN 12 07/25/2014 2311   CREATININE 0.47 03/15/2024 0509   CREATININE 0.93 07/25/2014 2311   CALCIUM 8.3 (L) 03/15/2024 0509   CALCIUM 8.4 (L) 07/25/2014 2311   PROT 5.5 (L) 03/15/2024 0509   ALBUMIN 2.9 (L) 03/15/2024 0509   AST 20 03/15/2024 0509   ALT 11 03/15/2024 0509   ALKPHOS 59 03/15/2024 0509   BILITOT 0.5 03/15/2024 0509   GFRNONAA >60 03/15/2024 0509   GFRNONAA >60 07/25/2014 2311   GFRAA >60 03/08/2019 0516   GFRAA >60 07/25/2014 2311   Lipase     Component Value Date/Time   LIPASE 30 03/13/2024 0842    Studies/Results: CT ABDOMEN PELVIS W CONTRAST Result Date: 03/13/2024 CLINICAL DATA:  Acute abdominal pain vomiting, and diarrhea for 2 weeks. Gastritis. EXAM: CT ABDOMEN AND PELVIS WITH CONTRAST TECHNIQUE: Multidetector CT imaging of the abdomen and pelvis was performed using the standard protocol following bolus administration of intravenous contrast. RADIATION DOSE REDUCTION: This exam was performed according to the departmental dose-optimization program which includes automated exposure control, adjustment of the mA  and/or kV according to patient size and/or use of iterative reconstruction technique. CONTRAST:  80mL OMNIPAQUE IOHEXOL 300 MG/ML  SOLN COMPARISON:  None Available. FINDINGS: Lower Chest: No acute findings. Hepatobiliary: No suspicious hepatic masses identified. Prior cholecystectomy. No evidence of biliary obstruction. Pancreas:  No mass or inflammatory changes. Spleen: Within normal limits in size and appearance. Adrenals/Urinary Tract: No suspicious masses identified. No evidence of ureteral calculi or hydronephrosis. Stomach/Bowel: Prior gastric bypass surgery noted. A small bowel intussusception measuring approximately 7 cm length is seen in the central  abdomen, at the site of the jejuno-jejunostomy anastomosis. No evidence of bowel obstruction, inflammatory process or abnormal fluid collections. Vascular/Lymphatic: No pathologically enlarged lymph nodes. No acute vascular findings. Reproductive:  No mass or other significant abnormality. Other:  None. Musculoskeletal:  No suspicious bone lesions identified. IMPRESSION: Prior Roux-en-Y gastric bypass surgery, with small bowel intussusception at the site of the jejuno-jejunostomy anastomosis. No evidence of bowel obstruction, perforation, or inflammatory process. Electronically Signed   By: Danae Orleans M.D.   On: 03/13/2024 12:01    Anti-infectives: Anti-infectives (From admission, onward)    Start     Dose/Rate Route Frequency Ordered Stop   03/14/24 0912  sodium chloride 0.9 % with cefoTEtan (CEFOTAN) ADS Med       Note to Pharmacy: Evern Bio K: cabinet override      03/14/24 0912 03/14/24 1032   03/14/24 0815  cefoTEtan (CEFOTAN) 2 g in sodium chloride 0.9 % 100 mL IVPB        2 g 200 mL/hr over 30 Minutes Intravenous On call to O.R. 03/14/24 0716 03/14/24 1032        Assessment/Plan POD 1 s/p diagnostic laparoscopy, laparoscopic small bowel resection (jejunojejunostomy) with creation of small bowel anastomosis x 2; Laparoscopic closure of internal hernia by Dr. 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 When able to adv, will plan for bari FLD for 2 weeks - Multimodal pain control, adjust today.  - Mobilize - Pulm toilet  FEN - Bariatric CLD. Shakes. Cont IVF VTE - SCDs, Lovenox  ID - Cefotetan peri-op Foley - None    LOS: 2 days    Jacinto Halim, [facility] 03/15/2024, 9:12 AM Please see Amion for pager number during day hours 7:00am-4:30pm

## 2024-03-15 NOTE — Anesthesia Postprocedure Evaluation (Signed)
 Anesthesia Post Note  Patient: Ashley Paul  Procedure(s) Performed: LAPAROSCOPY, DIAGNOSTIC  Laparosocpic smallbowel resection ; Laparoscopic closure of internal hernia     Patient location during evaluation: PACU Anesthesia Type: General Level of consciousness: awake and alert Pain management: pain level controlled Vital Signs Assessment: post-procedure vital signs reviewed and stable Respiratory status: spontaneous breathing, nonlabored ventilation, respiratory function stable and patient connected to nasal cannula oxygen Cardiovascular status: blood pressure returned to baseline and stable Postop Assessment: no apparent nausea or vomiting Anesthetic complications: no   No notable events documented.  Last Vitals:  Vitals:   03/15/24 0907 03/15/24 1331  BP: 130/85 138/87  Pulse: 84 76  Resp: 14 16  Temp: 36.8 C 36.8 C  SpO2: 100% 98%    Last Pain:  Vitals:   03/15/24 1431  TempSrc:   PainSc: 9                  Bow Buntyn

## 2024-03-15 NOTE — Plan of Care (Signed)
 ?  Problem: Clinical Measurements: ?Goal: Ability to maintain clinical measurements within normal limits will improve ?Outcome: Progressing ?Goal: Will remain free from infection ?Outcome: Progressing ?Goal: Diagnostic test results will improve ?Outcome: Progressing ?  ?

## 2024-03-15 NOTE — Progress Notes (Signed)
   03/15/24 1255  TOC Brief Assessment  Insurance and Status Reviewed  Patient has primary care physician Yes  Home environment has been reviewed resides in private residence with spouse  Prior level of function: Independent  Prior/Current Home Services No current home services  Social Drivers of Health Review SDOH reviewed no interventions necessary  Readmission risk has been reviewed Yes  Transition of care needs no transition of care needs at this time

## 2024-03-16 ENCOUNTER — Other Ambulatory Visit (HOSPITAL_COMMUNITY): Payer: Self-pay

## 2024-03-16 LAB — CBC WITH DIFFERENTIAL/PLATELET
Abs Immature Granulocytes: 0.03 10*3/uL (ref 0.00–0.07)
Basophils Absolute: 0.1 10*3/uL (ref 0.0–0.1)
Basophils Relative: 1 %
Eosinophils Absolute: 0.2 10*3/uL (ref 0.0–0.5)
Eosinophils Relative: 3 %
HCT: 34.4 % — ABNORMAL LOW (ref 36.0–46.0)
Hemoglobin: 11.3 g/dL — ABNORMAL LOW (ref 12.0–15.0)
Immature Granulocytes: 0 %
Lymphocytes Relative: 21 %
Lymphs Abs: 1.6 10*3/uL (ref 0.7–4.0)
MCH: 28.3 pg (ref 26.0–34.0)
MCHC: 32.8 g/dL (ref 30.0–36.0)
MCV: 86 fL (ref 80.0–100.0)
Monocytes Absolute: 0.8 10*3/uL (ref 0.1–1.0)
Monocytes Relative: 10 %
Neutro Abs: 5.1 10*3/uL (ref 1.7–7.7)
Neutrophils Relative %: 65 %
Platelets: 252 10*3/uL (ref 150–400)
RBC: 4 MIL/uL (ref 3.87–5.11)
RDW: 13.5 % (ref 11.5–15.5)
Smear Review: NORMAL
WBC: 7.8 10*3/uL (ref 4.0–10.5)
nRBC: 0 % (ref 0.0–0.2)

## 2024-03-16 LAB — SURGICAL PATHOLOGY

## 2024-03-16 MED ORDER — ONDANSETRON 4 MG PO TBDP
4.0000 mg | ORAL_TABLET | Freq: Four times a day (QID) | ORAL | 0 refills | Status: AC | PRN
  Filled 2024-03-16: qty 20, 5d supply, fill #0

## 2024-03-16 NOTE — Progress Notes (Signed)
 2 Days Post-Op  Subjective: CC: Feeling better today and wants to go home.  Some R sided abdominal pain that is worse when she lays on her left side. She has some pain around her incisions as well that is worse with movement. Pain well controlled. No longer needing IV pain medications since I saw her yesterday. Nausea improved. Did require zofran this am. She thinks nausea was related to being hungry. No nausea related to meds or po intake. Nausea improved after zofran. No vomiting. Tolerating bari fld. Had 2 protein shakes yesterday. Passing flatus. Liquid bm yesterday that are becoming more formed. Voiding. Mobilizing.   Afebrile. No tachycardia or hypotension. WBC wnl. Hgb stable at 11.3.   Objective: Vital signs in last 24 hours: Temp:  [97.7 F (36.5 C)-98.6 F (37 C)] 98.6 F (37 C) (03/20 0610) Pulse Rate:  [68-83] 83 (03/20 0849) Resp:  [16-18] 18 (03/20 0849) BP: (131-151)/(81-87) 135/87 (03/20 0849) SpO2:  [98 %-100 %] 98 % (03/20 0849) Last BM Date : 03/16/24  Intake/Output from previous day: 03/19 0701 - 03/20 0700 In: 1638.5 [P.O.:1020; I.V.:618.5] Out: 740 [Urine:740] Intake/Output this shift: No intake/output data recorded.  PE: Gen:  Alert, NAD, pleasant Card:  Reg Pulm:  Rate and effort normal Abd: Soft, improved mild distension, ttp across her upper abdomen that is improved from yesterday. No rigidity or guarding. +BS. Incisions with glue intact appears well and are without drainage, bleeding, or signs of infection.  Ext:  No LE edema  Psych: A&Ox3   Lab Results:  Recent Labs    03/15/24 0509 03/16/24 0505  WBC 7.1 7.8  HGB 11.8* 11.3*  HCT 35.4* 34.4*  PLT 259 252   BMET Recent Labs    03/14/24 0432 03/15/24 0509  NA 137 140  K 3.3* 3.7  CL 108 110  CO2 21* 22  GLUCOSE 88 106*  BUN 10 5*  CREATININE 0.39* 0.47  CALCIUM 8.0* 8.3*   PT/INR No results for input(s): "LABPROT", "INR" in the last 72 hours. CMP     Component Value  Date/Time   NA 140 03/15/2024 0509   NA 140 07/25/2014 2311   K 3.7 03/15/2024 0509   K 3.5 07/25/2014 2311   CL 110 03/15/2024 0509   CL 106 07/25/2014 2311   CO2 22 03/15/2024 0509   CO2 25 07/25/2014 2311   GLUCOSE 106 (H) 03/15/2024 0509   GLUCOSE 145 (H) 07/25/2014 2311   BUN 5 (L) 03/15/2024 0509   BUN 12 07/25/2014 2311   CREATININE 0.47 03/15/2024 0509   CREATININE 0.93 07/25/2014 2311   CALCIUM 8.3 (L) 03/15/2024 0509   CALCIUM 8.4 (L) 07/25/2014 2311   PROT 5.5 (L) 03/15/2024 0509   ALBUMIN 2.9 (L) 03/15/2024 0509   AST 20 03/15/2024 0509   ALT 11 03/15/2024 0509   ALKPHOS 59 03/15/2024 0509   BILITOT 0.5 03/15/2024 0509   GFRNONAA >60 03/15/2024 0509   GFRNONAA >60 07/25/2014 2311   GFRAA >60 03/08/2019 0516   GFRAA >60 07/25/2014 2311   Lipase     Component Value Date/Time   LIPASE 30 03/13/2024 0842    Studies/Results: No results found.   Anti-infectives: Anti-infectives (From admission, onward)    Start     Dose/Rate Route Frequency Ordered Stop   03/14/24 0912  sodium chloride 0.9 % with cefoTEtan (CEFOTAN) ADS Med       Note to Pharmacy: Evern Bio K: cabinet override      03/14/24 0912  03/14/24 1032   03/14/24 0815  cefoTEtan (CEFOTAN) 2 g in sodium chloride 0.9 % 100 mL IVPB        2 g 200 mL/hr over 30 Minutes Intravenous On call to O.R. 03/14/24 0716 03/14/24 1032        Assessment/Plan POD 2 s/p diagnostic laparoscopy, laparoscopic small bowel resection (jejunojejunostomy) with creation of small bowel anastomosis x 2; Laparoscopic closure of internal hernia by Dr. 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 Bariatric RN and RD to see before d/c (consults placed) - Mobilize - Pulm toilet - If patient tolerates lunch without any further nausea, will plan for d/c today. Discussed discharge instructions, restrictions and  return/call back precautions. Will arrange f/u. She is on hydrocodone and tramadol at baseline - she doesn't want a script for pain medications except for muscle relaxer at d/c. Discussed plan w/ RN. Will f/u this afternoon with RN to see how she tolerates lunch for possible d/c.   FEN - Bariatric FLD. Shakes. SLIV VTE - SCDs, Lovenox  ID - Cefotetan peri-op Foley - None    LOS: 3 days    Jacinto Halim, [facility] 03/16/2024, 9:25 AM Please see Amion for pager number during day hours 7:00am-4:30pm

## 2024-03-16 NOTE — Progress Notes (Signed)
 Nutrition Education Note  Received consult for diet education for patient s/p bariatric surgery.  Discussed 2 week post op diet with pt. Emphasized that liquids must be non carbonated, non caffeinated, and sugar free. Fluid goals discussed. Pt to follow up with outpatient bariatric team for further diet progression after 2 weeks. Multivitamins and minerals also reviewed. Teach back method used, pt expressed understanding, expect good compliance.  All questions answered.  If nutrition issues arise, please consult RD.  Tilda Franco, MS, RD, LDN Inpatient Clinical Dietitian Contact via Secure chat

## 2024-03-16 NOTE — Progress Notes (Signed)
     Name:   Ashley Paul              Patient MRN: [id number]  DOA: 03/13/2024   Patient seen in room 1312  Patient alert and oriented, sitting upright in recliner. Patient stated she is ready to go home and is tolerating her liquid intake well. Patient states she feels better than prior to arrival. Incisions c/d/I , no noted tenderness upon abdominal palpation. Patient able to move self.    Vital signs in last 24 hours:    03/16/2024    8:49 AM 03/16/2024    6:10 AM 03/15/2024    9:29 PM  Vitals with BMI  Systolic 135 151 782  Diastolic 87 87 87  Pulse 83 68 73     Intake/Output:   Intake/Output Summary (Last 24 hours) at 03/16/2024 1133 Last data filed at 03/16/2024 0553 Gross per 24 hour  Intake 1578.51 ml  Output 340 ml  Net 1238.51 ml    24 hr fluid recall PO intake: 960 mL  CBC Lab Results: 03/16/24  Latest Reference Range & Units 03/16/24 05:05  WBC 4.0 - 10.5 K/uL 7.8  RBC 3.87 - 5.11 MIL/uL 4.00  Hemoglobin 12.0 - 15.0 g/dL 95.6 (L)  HCT 21.3 - 08.6 % 34.4 (L)  MCV 80.0 - 100.0 fL 86.0  MCH 26.0 - 34.0 pg 28.3  MCHC 30.0 - 36.0 g/dL 57.8  RDW 46.9 - 62.9 % 13.5  Platelets 150 - 400 K/uL 252  nRBC 0.0 - 0.2 % 0.0  (L): Data is abnormally low    Assessment / Plan Recommendations:  Patient reminded of Gastric sleeve/bypass post-op recovery instructions and patient is able to articulate understanding. Patient provided with post-op diet recommendations for liquid diet (for the next 2 weeks per provider request) and soft diet pending approval from multidisciplinary team follow up. Provided information on BELT program, Support Group, BSTOP-D, and [facility] outpatient pharmacy. Communicated general update of patient status to surgeon. All questions addressed.     Thank you,  Lubertha Basque, RN, MSN Bariatric Nurse Coordinator [phone] (office)

## 2024-03-16 NOTE — Discharge Instructions (Signed)
 GASTRIC BYPASS/SLEEVE  Home Care Instructions   These instructions are to help you care for yourself when you go home.  Call: If you have any problems. Call [phone] and ask for the surgeon on call If you need immediate help, come to the ER at [facility].  Tell the ER staff that you are a new post-op gastric bypass or gastric sleeve patient   Signs and symptoms to report: Severe vomiting or nausea If you cannot keep down clear liquids for longer than 1 day, call your surgeon  Abdominal pain that does not get better after taking your pain medication Fever over 100.4 F with chills Heart beating over 100 beats a minute Shortness of breath at rest Chest pain  Redness, swelling, drainage, or foul odor at incision (surgical) sites  If your incisions open or pull apart Swelling or pain in calf (lower leg) Diarrhea (Loose bowel movements that happen often), frequent watery, uncontrolled bowel movements Constipation, (no bowel movements for 3 days) if this happens: Pick one Milk of Magnesia, 2 tablespoons by mouth, 3 times a day for 2 days if needed Stop taking Milk of Magnesia once you have a bowel movement Call your doctor if constipation continues Or Miralax  (instead of Milk of Magnesia) following the label instructions Stop taking Miralax once you have a bowel movement Call your doctor if constipation continues Anything you think is not normal   Normal side effects after surgery: Unable to sleep at night or unable to focus Irritability or moody Being tearful (crying) or depressed These are common complaints, possibly related to your anesthesia medications that put you to sleep, stress of surgery, and change in lifestyle.  This usually goes away a few weeks after surgery.  If these feelings continue, call your primary care doctor.   Wound Care: You may have surgical glue, steri-strips, or staples over your incisions after surgery Surgical glue:  Looks like a clear film  over your incisions and will wear off a little at a time Steri-strips: Strips of tape over your incisions. You may notice a yellowish color on the skin under the steri-strips. This is used to make the   steri-strips stick better. Do not pull the steri-strips off - let them fall off Staples: Staples may be removed before you leave the hospital If you go home with staples, call [facility], [phone] at for an appointment with your surgeon's nurse to have staples removed 10 days after surgery. Showering: You may shower two (2) days after your surgery unless your surgeon tells you differently Wash gently around incisions with warm soapy water, rinse well, and gently pat dry  No tub baths until staples are removed, steri-strips fall off or glue is gone.    Medications: Medications should be liquid or crushed if larger than the size of a dime Extended release pills (medication that release a little bit at a time through the day) should NOT be crushed or cut. (examples include XL, ER, DR, SR) Depending on the size and number of medications you take, you may need to space (take a few throughout the day)/change the time you take your medications so that you do not over-fill your pouch (smaller stomach) Make sure you follow-up with your primary care doctor to make medication changes needed during rapid weight loss and life-style changes If you have diabetes, follow up with the doctor that orders your diabetes medication(s) within one week after surgery and check your blood sugar regularly.  Do not drive while taking prescription pain medication  It is ok to take Tylenol by the bottle instructions with your pain medicine or instead of your pain medicine as needed.  DO NOT TAKE NSAIDS (EXAMPLES OF NSAIDS:  IBUPROFREN/ NAPROXEN)  Diet:                    First 2 Weeks  You will see the dietician t about two (2) weeks after your surgery. The dietician will increase the types of foods you can eat  if you are handling liquids well: If you have severe vomiting or nausea and cannot keep down clear liquids lasting longer than 1 day, call your surgeon @ [phone]) Protein Shake Drink at least 2 ounces of shake 5-6 times per day Each serving of protein shakes (usually 8 - 12 ounces) should have: 15 grams of protein  And no more than 5 grams of carbohydrate  Goal for protein each day: Men = 80 grams per day Women = 60 grams per day Protein powder may be added to fluids such as non-fat milk or Lactaid milk or unsweetened Soy/Almond milk (limit to 35 grams added protein powder per serving)  Hydration Slowly increase the amount of water and other clear liquids as tolerated (See Acceptable Fluids) Slowly increase the amount of protein shake as tolerated   Sip fluids slowly and throughout the day.  Do not use straws. May use sugar substitutes in small amounts (no more than 6 - 8 packets per day; i.e. Splenda)  Fluid Goal The first goal is to drink at least 8 ounces of protein shake/drink per day (or as directed by the nutritionist); some examples of protein shakes are [employer], Dillard's, EAS Edge HP, and Unjury. See handout from pre-op Bariatric Education Class: Slowly increase the amount of protein shake you drink as tolerated You may find it easier to slowly sip shakes throughout the day It is important to get your proteins in first Your fluid goal is to drink 64 - 100 ounces of fluid daily It may take a few weeks to build up to this 32 oz (or more) should be clear liquids  And  32 oz (or more) should be full liquids (see below for examples) Liquids should not contain sugar, caffeine, or carbonation  Clear Liquids: Water or Sugar-free flavored water (i.e. Fruit H2O, Propel) Decaffeinated coffee or tea (sugar-free) Crystal Lite, Wyler's Lite, Minute Maid Lite Sugar-free Jell-O Bouillon or broth Sugar-free Popsicle:   *Less than 20 calories each; Limit 1 per  day  Full Liquids: Protein Shakes/Drinks + 2 choices per day of other full liquids Full liquids must be: No More Than 15 grams of Carbs per serving  No More Than 3 grams of Fat per serving Strained low-fat cream soup (except Cream of Potato or Tomato) Non-Fat milk Fat-free Lactaid Milk Unsweetened Soy Or Unsweetened Almond Milk Low Sugar yogurt (Dannon Lite & Fit, Greek yogurt; Oikos Triple Zero; Chobani Simply 100; Yoplait 100 calorie [place] - No Fruit on the Bottom)    Vitamins and Minerals Start 1 day after surgery unless otherwise directed by your surgeon 2 Chewable Bariatric Specific Multivitamin / Multimineral Supplement with iron (Example: Bariatric Advantage Multi EA) Chewable Calcium with Vitamin D-3 (Example: 3 Chewable Calcium Plus 600 with Vitamin D-3) Take 500 mg three (3) times a day for a total of 1500 mg each day Do not take all 3 doses of calcium at one time as it may cause constipation, and  you can only absorb 500 mg  at a time  Do not mix multivitamins containing iron with calcium supplements; take 2 hours apart Menstruating women and those with a history of anemia (a blood disease that causes weakness) may need extra iron Talk with your doctor to see if you need more iron Do not stop taking or change any vitamins or minerals until you talk to your dietitian or surgeon Your Dietitian and/or surgeon must approve all vitamin and mineral supplements   Activity and Exercise: Limit your physical activity as instructed by your doctor.  It is important to continue walking at home.  During this time, use these guidelines: Do not lift anything greater than ten (10) pounds for at least two (2) weeks Do not go back to work or drive until Designer, industrial/product says you can You may have sex when you feel comfortable  It is VERY important for female patients to use a reliable birth control method; fertility often increases after surgery  All hormonal birth control will be ineffective for 30  days after surgery due to medications given during surgery a barrier method must be used. Do not get pregnant for at least 18 months Start exercising as soon as your doctor tells you that you can Make sure your doctor approves any physical activity Start with a simple walking program Walk 5-15 minutes each day, 7 days per week.  Slowly increase until you are walking 30-45 minutes per day Consider joining our BELT program. [phone] or email belt@[employer] .edu   Special Instructions Things to remember: Use your CPAP when sleeping if this applies to you  [facility] has two free Bariatric Surgery Support Groups that meet monthly The 3rd Thursday of each month, 6 pm, [facility] Classrooms  The 2nd Friday of each month, 11:45 am in the private dining room in the basement of [facility] It is very important to keep all follow up appointments with your surgeon, dietitian, primary care physician, and behavioral health practitioner Routine follow up schedule with your surgeon include appointments at 2-3 weeks, 6-8 weeks, 6 months, and 1 year at a minimum.  Your surgeon may request to see you more often.   After the first year, please follow up with your bariatric surgeon and dietitian at least once a year in order to maintain best weight loss results [facility]: [phone] [facility]: [phone] Bariatric Nurse Coordinator: [phone]      Reviewed and Endorsed  by [facility] Patient Education Committee, June, 2016 Edits Approved: Aug, 2018

## 2024-03-16 NOTE — Progress Notes (Signed)
 Patient was given discharge instructions, and all questions were answered. Patient was stable for discharge and was walked to the main exit.

## 2024-03-17 ENCOUNTER — Other Ambulatory Visit: Payer: Self-pay | Admitting: Family Medicine

## 2024-03-17 DIAGNOSIS — Z1231 Encounter for screening mammogram for malignant neoplasm of breast: Secondary | ICD-10-CM

## 2024-03-17 NOTE — Discharge Summary (Signed)
 Patient ID: Ashley Paul [id number] [birth year] 53 y.o.  Admit date: 03/13/2024 Discharge date: 03/17/2024  Discharge Diagnosis S/p diagnostic laparoscopy, laparoscopic small bowel resection (jejunojejunostomy) with creation of small bowel anastomosis x 2; Laparoscopic closure of internal hernia by Dr. 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 Doylene Canard.  She had been doing well until about 2 weeks ago when she started having some nausea with intermittent belching.  She then started having diarrhea and then started having vomiting with cramping abdominal pain.  She was seen in the emergency department on 3/14.  Labs on that day were normal and no imaging was performed.  She presented to [facility] this morning with ongoing complaints of cramping abdominal pain with nausea and diarrhea.  She underwent a CT scan of the abdomen and pelvis suggesting intussusception at her JJ anastomosis.  The decision was made to transfer her to [facility] for further evaluation.  She reports that now she has minimal discomfort.  Her biggest complaint is intermittent nausea.  She has no cardiopulmonary issues and no other complaints.  She had no hematemesis or blood in her stool.   Procedures Dr.Wilson - 03/14/24 - LAPAROSCOPY, DIAGNOSTIC  Laparosocpic small bowel resection (jejunojejunostomy) with creation of small bowel anastomosis x 2; Laparoscopic closure of internal hernia   Hospital Course:  Patient presented as above.  Patient was taken to the OR for above procedure and tolerated well.  Diet was advanced gradually to bariatric full's postoperatively.  Patient tolerated this well.  Patient met with bariatric RN and RD postop for diet education.  On POD 2, patient was tolerating bariatric full's, having bowel  function, pain well-controlled, mobilizing, voiding, incisions clean/dry/intact and felt stable for discharge home.  Patient reports she did not want any additional pain medication in addition to her pain medication she already has at home.  She understands to stay on a bariatric full liquid diet for the next 2 weeks.  Follow-up as noted below. Discussed discharge instructions, restrictions and return/call back precautions.      Allergies as of 03/16/2024       Reactions   Azathioprine Other (See Comments)   Flu-like symptoms, tachycardia   Bupropion Hives   Lisinopril Cough   Mycophenolate Mofetil Hives   Misc. Sulfonamide Containing Compounds Other (See Comments)   Sulfa Antibiotics Swelling   SWELLING REACTION UNSPECIFIED    Pantoprazole Palpitations   Pantoprazole Sodium Palpitations   Singulair [montelukast] Anxiety   anxiety        Medication List     STOP taking these medications    oxyCODONE 5 MG/5ML solution Commonly known as: ROXICODONE   predniSONE 10 MG tablet Commonly known as: DELTASONE   traMADol 50 MG tablet Commonly known as: ULTRAM       TAKE these medications    busPIRone 5 MG tablet Commonly known as: BUSPAR Take 1 tablet (5 mg total) by mouth 2 (two) times daily   Clobetasol Propionate 0.05 % shampoo Apply thin film to dry scalp leave in place for 15 minutes, then add water and lather.  Rinse thoroughly. Use once daily   clobetasol cream 0.05 % Commonly known as: TEMOVATE Apply topically 2 (two) times daily   HYDROcodone-acetaminophen 10-325 MG tablet Commonly known as: NORCO Take 1 tablet by mouth 3 (three) times daily  as needed. What changed: Another medication with the same name was removed. Continue taking this medication, and follow the directions you see here.   loperamide 2 MG capsule Commonly known as: IMODIUM Take 2 mg by mouth as needed for diarrhea or loose stools.   ondansetron 4 MG disintegrating tablet Commonly known as:  ZOFRAN-ODT Take 1 tablet (4 mg total) by mouth every 6 (six) hours as needed for nausea or vomiting. What changed: when to take this   sertraline 100 MG tablet Commonly known as: ZOLOFT Take 1.5 tablets (150 mg total) by mouth at bedtime. What changed: Another medication with the same name was removed. Continue taking this medication, and follow the directions you see here.          Follow-up Information     [name], MD. Schedule an appointment as soon as possible for a visit in 2 week(s).   Specialty: General Surgery Why: For wound re-check Contact information: [address] [place] [address] [place] [address] [phone]                 Signed: Leary Roca, [facility] Surgery 03/17/2024, 4:46 PM Please see Amion for pager number during day hours 7:00am-4:30pm

## 2024-03-20 ENCOUNTER — Other Ambulatory Visit (HOSPITAL_COMMUNITY): Payer: Self-pay

## 2024-03-20 MED ORDER — TRAMADOL HCL 50 MG PO TABS
50.0000 mg | ORAL_TABLET | Freq: Four times a day (QID) | ORAL | 0 refills | Status: DC | PRN
  Filled 2024-03-20 – 2024-03-23 (×2): qty 60, 15d supply, fill #0

## 2024-03-23 ENCOUNTER — Other Ambulatory Visit (HOSPITAL_COMMUNITY): Payer: Self-pay

## 2024-03-23 ENCOUNTER — Other Ambulatory Visit: Payer: Self-pay

## 2024-03-27 ENCOUNTER — Other Ambulatory Visit: Payer: Self-pay

## 2024-03-28 ENCOUNTER — Other Ambulatory Visit: Payer: Self-pay

## 2024-03-29 ENCOUNTER — Other Ambulatory Visit: Payer: Self-pay

## 2024-03-29 DIAGNOSIS — E538 Deficiency of other specified B group vitamins: Secondary | ICD-10-CM | POA: Diagnosis not present

## 2024-03-29 DIAGNOSIS — F17200 Nicotine dependence, unspecified, uncomplicated: Secondary | ICD-10-CM | POA: Diagnosis not present

## 2024-03-29 DIAGNOSIS — Z1211 Encounter for screening for malignant neoplasm of colon: Secondary | ICD-10-CM | POA: Diagnosis not present

## 2024-03-29 DIAGNOSIS — Z0000 Encounter for general adult medical examination without abnormal findings: Secondary | ICD-10-CM | POA: Diagnosis not present

## 2024-03-29 DIAGNOSIS — F419 Anxiety disorder, unspecified: Secondary | ICD-10-CM | POA: Diagnosis not present

## 2024-03-29 DIAGNOSIS — E559 Vitamin D deficiency, unspecified: Secondary | ICD-10-CM | POA: Diagnosis not present

## 2024-03-29 DIAGNOSIS — Z1331 Encounter for screening for depression: Secondary | ICD-10-CM | POA: Diagnosis not present

## 2024-03-29 DIAGNOSIS — R7303 Prediabetes: Secondary | ICD-10-CM | POA: Diagnosis not present

## 2024-03-29 DIAGNOSIS — E049 Nontoxic goiter, unspecified: Secondary | ICD-10-CM | POA: Diagnosis not present

## 2024-03-29 DIAGNOSIS — I10 Essential (primary) hypertension: Secondary | ICD-10-CM | POA: Diagnosis not present

## 2024-03-30 ENCOUNTER — Other Ambulatory Visit: Payer: Self-pay

## 2024-03-30 ENCOUNTER — Encounter

## 2024-03-31 ENCOUNTER — Other Ambulatory Visit: Payer: Self-pay

## 2024-04-05 ENCOUNTER — Other Ambulatory Visit: Payer: Self-pay

## 2024-04-05 MED ORDER — TRAMADOL HCL 50 MG PO TABS
50.0000 mg | ORAL_TABLET | Freq: Four times a day (QID) | ORAL | 0 refills | Status: DC | PRN
  Filled 2024-04-06 (×2): qty 60, 15d supply, fill #0

## 2024-04-06 ENCOUNTER — Other Ambulatory Visit: Payer: Self-pay

## 2024-04-07 ENCOUNTER — Other Ambulatory Visit (HOSPITAL_COMMUNITY): Payer: Self-pay

## 2024-04-07 MED ORDER — HYDROCODONE-ACETAMINOPHEN 10-325 MG PO TABS
1.0000 | ORAL_TABLET | Freq: Two times a day (BID) | ORAL | 0 refills | Status: DC | PRN
  Filled 2024-04-07 – 2024-04-10 (×2): qty 60, 30d supply, fill #0

## 2024-04-10 ENCOUNTER — Other Ambulatory Visit: Payer: Self-pay

## 2024-04-10 ENCOUNTER — Other Ambulatory Visit (HOSPITAL_COMMUNITY): Payer: Self-pay

## 2024-04-12 ENCOUNTER — Encounter

## 2024-04-14 ENCOUNTER — Other Ambulatory Visit: Payer: Self-pay | Admitting: Family Medicine

## 2024-04-14 DIAGNOSIS — E049 Nontoxic goiter, unspecified: Secondary | ICD-10-CM

## 2024-04-14 DIAGNOSIS — Z0000 Encounter for general adult medical examination without abnormal findings: Secondary | ICD-10-CM

## 2024-04-16 ENCOUNTER — Other Ambulatory Visit: Payer: Self-pay

## 2024-04-16 MED ORDER — TRAMADOL HCL 50 MG PO TABS
50.0000 mg | ORAL_TABLET | Freq: Four times a day (QID) | ORAL | 0 refills | Status: DC | PRN
  Filled 2024-04-16 – 2024-04-19 (×2): qty 60, 15d supply, fill #0

## 2024-04-17 ENCOUNTER — Other Ambulatory Visit: Payer: Self-pay

## 2024-04-19 ENCOUNTER — Other Ambulatory Visit: Payer: Self-pay

## 2024-05-03 ENCOUNTER — Other Ambulatory Visit: Payer: Self-pay

## 2024-05-03 MED ORDER — TRAMADOL HCL 50 MG PO TABS
50.0000 mg | ORAL_TABLET | Freq: Four times a day (QID) | ORAL | 0 refills | Status: DC | PRN
  Filled 2024-05-03: qty 60, 15d supply, fill #0

## 2024-05-04 ENCOUNTER — Other Ambulatory Visit: Payer: Self-pay

## 2024-05-05 ENCOUNTER — Other Ambulatory Visit: Payer: Self-pay

## 2024-05-08 ENCOUNTER — Other Ambulatory Visit: Payer: Self-pay

## 2024-05-09 ENCOUNTER — Other Ambulatory Visit: Payer: Self-pay

## 2024-05-09 ENCOUNTER — Other Ambulatory Visit (HOSPITAL_COMMUNITY): Payer: Self-pay

## 2024-05-09 MED ORDER — TRAMADOL HCL 50 MG PO TABS
50.0000 mg | ORAL_TABLET | Freq: Four times a day (QID) | ORAL | 0 refills | Status: DC | PRN
Start: 2024-05-09 — End: 2024-05-17

## 2024-05-09 MED ORDER — TRAMADOL HCL 50 MG PO TABS
50.0000 mg | ORAL_TABLET | Freq: Four times a day (QID) | ORAL | 0 refills | Status: AC | PRN
Start: 2024-05-09 — End: ?
  Filled 2024-05-09 – 2024-05-16 (×3): qty 60, 15d supply, fill #0

## 2024-05-10 ENCOUNTER — Other Ambulatory Visit: Payer: Self-pay

## 2024-05-10 MED ORDER — HYDROCODONE-ACETAMINOPHEN 10-325 MG PO TABS
1.0000 | ORAL_TABLET | Freq: Two times a day (BID) | ORAL | 0 refills | Status: DC | PRN
Start: 2024-05-10 — End: 2024-05-17
  Filled 2024-05-10: qty 60, 30d supply, fill #0

## 2024-05-16 ENCOUNTER — Other Ambulatory Visit: Payer: Self-pay

## 2024-05-30 ENCOUNTER — Other Ambulatory Visit: Payer: Self-pay

## 2024-05-31 ENCOUNTER — Other Ambulatory Visit: Payer: Self-pay

## 2024-07-14 ENCOUNTER — Other Ambulatory Visit: Payer: Self-pay

## 2024-09-23 ENCOUNTER — Other Ambulatory Visit: Payer: Self-pay
